# Patient Record
Sex: Male | Born: 1940 | Race: White | Hispanic: No | Marital: Married | Smoking: Current every day smoker
Health system: Southern US, Community
[De-identification: ages and names within clinical notes are randomized; demographics above are authoritative.]

## PROBLEM LIST (undated history)

## (undated) DIAGNOSIS — Z8719 Personal history of other diseases of the digestive system: Secondary | ICD-10-CM

## (undated) DIAGNOSIS — I714 Abdominal aortic aneurysm, without rupture, unspecified: Secondary | ICD-10-CM

## (undated) DIAGNOSIS — M1990 Unspecified osteoarthritis, unspecified site: Secondary | ICD-10-CM

## (undated) DIAGNOSIS — G4730 Sleep apnea, unspecified: Secondary | ICD-10-CM

## (undated) DIAGNOSIS — E785 Hyperlipidemia, unspecified: Secondary | ICD-10-CM

## (undated) DIAGNOSIS — K219 Gastro-esophageal reflux disease without esophagitis: Secondary | ICD-10-CM

## (undated) DIAGNOSIS — F419 Anxiety disorder, unspecified: Secondary | ICD-10-CM

## (undated) DIAGNOSIS — I10 Essential (primary) hypertension: Secondary | ICD-10-CM

## (undated) DIAGNOSIS — E119 Type 2 diabetes mellitus without complications: Secondary | ICD-10-CM

## (undated) DIAGNOSIS — N400 Enlarged prostate without lower urinary tract symptoms: Secondary | ICD-10-CM

## (undated) HISTORY — PX: CHOLECYSTECTOMY: SHX55

## (undated) HISTORY — DX: Abdominal aortic aneurysm, without rupture: I71.4

## (undated) HISTORY — DX: Abdominal aortic aneurysm, without rupture, unspecified: I71.40

## (undated) HISTORY — DX: Unspecified osteoarthritis, unspecified site: M19.90

## (undated) HISTORY — DX: Hyperlipidemia, unspecified: E78.5

## (undated) HISTORY — DX: Essential (primary) hypertension: I10

## (undated) HISTORY — DX: Benign prostatic hyperplasia without lower urinary tract symptoms: N40.0

## (undated) HISTORY — DX: Personal history of other diseases of the digestive system: Z87.19

## (undated) HISTORY — DX: Type 2 diabetes mellitus without complications: E11.9

## (undated) HISTORY — DX: Gastro-esophageal reflux disease without esophagitis: K21.9

---

## 1978-12-12 HISTORY — PX: VASECTOMY: SHX75

## 2007-05-04 ENCOUNTER — Ambulatory Visit: Payer: Self-pay | Admitting: *Deleted

## 2007-10-19 ENCOUNTER — Ambulatory Visit: Payer: Self-pay | Admitting: *Deleted

## 2008-09-05 ENCOUNTER — Ambulatory Visit: Payer: Self-pay | Admitting: *Deleted

## 2008-09-12 ENCOUNTER — Ambulatory Visit: Payer: Self-pay | Admitting: *Deleted

## 2008-09-12 ENCOUNTER — Encounter: Admission: RE | Admit: 2008-09-12 | Discharge: 2008-09-12 | Payer: Self-pay | Admitting: *Deleted

## 2009-04-01 ENCOUNTER — Ambulatory Visit: Payer: Self-pay | Admitting: Vascular Surgery

## 2009-10-03 ENCOUNTER — Ambulatory Visit: Payer: Self-pay | Admitting: Vascular Surgery

## 2010-04-21 ENCOUNTER — Ambulatory Visit
Admission: RE | Admit: 2010-04-21 | Discharge: 2010-04-21 | Payer: Self-pay | Source: Home / Self Care | Attending: Vascular Surgery | Admitting: Vascular Surgery

## 2010-04-21 ENCOUNTER — Ambulatory Visit: Admit: 2010-04-21 | Payer: Self-pay | Admitting: Vascular Surgery

## 2010-08-25 NOTE — Procedures (Signed)
DUPLEX ULTRASOUND OF ABDOMINAL AORTA   INDICATION:  Abdominal aortic aneurysm.   HISTORY:  Diabetes:  No.  Cardiac:  No.  Hypertension:  No.  Smoking:  Yes.  Connective Tissue Disorder:  Family History:  No.  Previous Surgery:  No.   DUPLEX EXAM:         AP (cm)                   TRANSVERSE (cm)  Proximal             2.6 cm                    2.5 cm  Mid                  2.7 cm                    2.7 cm  Distal               5.0 cm                    5.0 cm  Right Iliac          2.4 cm                    2.3 cm  Left Iliac           1.1 cm                    1.2 cm   PREVIOUS:  Date: 04/01/2009  AP:  4.75  TRANSVERSE:  4.94   IMPRESSION:  1. Stable aneurysmal dilatation of the distal abdominal aorta and      right proximal common iliac arteries when compared to the previous      examination.  2. 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   ___________________________________________  Larina Earthly, M.D.   CH/MEDQ  D:  10/03/2009  T:  10/03/2009  Job:  [id number]

## 2010-08-25 NOTE — Assessment & Plan Note (Signed)
OFFICE VISIT   CLAYBURN, WEEKLY  DOB:  25-Aug-1940                                       09/05/2008  ZOXWR#:[id number]   The patient returned to the office today for surveillance ultrasound of  a known AAA.  His aneurysm has enlarged significantly, now measuring 5.5  cm in maximal diameter.  This compares to a previous study of 4.5 cm  carried out 1 year ago.   He continues to smoke 1 pack of cigarettes daily.   There is no history of abdominal pain or back pain.   PAST MEDICAL HISTORY:  Significant for hypertension and hyperlipidemia.   He currently takes a multivitamin and aspirin daily.   No known allergies.  He is intolerant of codeine.   PHYSICAL EXAMINATION:  He is a 69 year old gentleman, appears  approximately stated age.  Alert and oriented.  BP 150/88, pulse 64 per  minute.  Neck:  Supple, without thyromegaly or adenopathy.  There are no  carotid bruits.  Heart Sounds:  Normal, without murmurs.  Chest:  Clear  with equal entry bilaterally.  Abdomen:  Soft, nontender.  AAA palpable.  No organomegaly or other masses felt.  Normal bowel sounds without  bruits.  Femoral pulses 2+ bilaterally.  No ankle edema.   The patient has a significantly enlarged abdominal aortic aneurysm.  I  have ordered a CT angiogram of the abdomen and pelvis and return visit  to see me for planned further management of his enlarging AAA.   Balinda Quails, M.D.  Electronically Signed   PGH/MEDQ  D:  09/05/2008  T:  09/06/2008  Job:  [id number]   cc:   Selinda Flavin A. Cleotis Nipper, M.D.

## 2010-08-25 NOTE — Consult Note (Signed)
VASCULAR SURGERY CONSULTATION   Jonathan Levine, Jonathan Levine  DOB:  1940/06/28                                       05/04/2007  ZOXWR#:[id number]   REFERRING PHYSICIAN:  Sherilyn Cooter A. Cleotis Nipper, M.D.   PRIMARY CARE PHYSICIAN:  Dr. Ceasar Mons.   REFERRAL DIAGNOSIS:  A 4.5 cm abdominal aortic aneurysm.   HISTORY:  The patient is a 70 year old male recently admitted through  the emergency department to [facility] in [place] with  abdominal pain.  A CT scan revealed cholelithiasis.  He was seen and  evaluated by Dr. Cleotis Nipper with evaluation revealing lower abdominal  discomfort, this resolved spontaneously.  His cholelithiasis was felt to  be asymptomatic.  He has had no recurrence of abdominal pain.  Denies  nausea or vomiting.  Regular bowel habits.  No flank pain or back pain.   CT scan revealed 4.5 cm infrarenal abdominal aortic aneurysm.  Right  common iliac artery measures 2 cm and prostatic enlargement is noted.   The patient denies a family history of abdominal aortic aneurysm.  Risk  factors for abdominal aortic aneurysm include hypertension, male sex and  tobacco use.   PAST MEDICAL HISTORY:  1. Hypertension.  2. Hyperlipidemia.   MEDICATIONS:  1. Sleeping pill.  2. The patient discontinued the use of antihypertensive and lipid      lowering agents on his own.   ALLERGIES:  CODEINE.   FAMILY HISTORY:  Mother died at age 67 with a history of end-stage renal  failure and had a pacemaker.  He was estranged from his father.  No  siblings.   SOCIAL HISTORY:  The patient is married with 2 children.  He is now  retired, had worked as a Nature conservation officer.  Smokes 1 to 1-1/2 packs of cigarettes  per day.  Does not consume alcohol.   REVIEW OF SYSTEMS:  Refer to patient encounter form.  This was reviewed  and discussed with the patient today.  Negative complaints for general,  cardiac, pulmonary, vascular, orthopedic, psychiatric, ent, hematologic  or skin.  He  does note symptoms of GI reflux.  Had some urinary  frequency with nocturia.  Occasional headaches.   PHYSICAL EXAMINATION:  General:  A 70 year old male appears his stated  age.  Alert and oriented.  No acute distress.  Vital signs:  BP 135/88,  pulse 74 per minute, regular.  Respirations are 18 per minute.  O2 sat  98%.  HEENT:  Mouth and throat are clear.  Normocephalic.  Extraocular  movements intact.  No scleral icterus.  Neck:  No lymphadenopathy.  Normal thyroid.  Chest:  Equal air entry bilaterally.  No rales or  rhonchi.  Normal percussion.  Cardiovascular:  No carotid bruits.  Normal heart sounds without murmurs.  Regular rate and rhythm.  No rubs  or gallops.  Abdomen:  Soft, nontender.  No masses or organomegaly.  Normal bowel sounds.  No bruits.  Extremities:  2+ femoral, popliteal,  posterior tibia and dorsalis pedis pulses.  No ankle edema.  Normal  range of motion upper and lower extremities.  No joint deformity.  Neurological:  Cranial nerves intact.  Strength equal bilaterally.  2+  reflexes.  Skin:  Intact.  Warm, dry.  No ulceration or rash.   IMPRESSION:  1. A 4.5 cm infrarenal abdominal aortic aneurysm.  2. Asymptomatic cholelithiasis.  3.  Hypertension.  4. Hyperlipidemia.  5. Poor medical compliance.   RECOMMENDATIONS:  Follow up in 6 months with ultrasound of the abdomen.  The patient needs to follow up with his primary care physician, Dr.  Jenean Lindau, regarding antihypertensive and antilipid agents.   Balinda Quails, M.D.  Electronically Signed  PGH/MEDQ  D:  05/04/2007  T:  05/05/2007  Job:  [id number]   cc:   Selinda Flavin A. Cleotis Nipper, M.D.

## 2010-08-25 NOTE — Assessment & Plan Note (Signed)
OFFICE VISIT   Jonathan Levine, Jonathan Levine  DOB:  1940/08/28                                       09/12/2008  EAVWU#:[id number]   The patient returned to the office today after undergoing a CT angiogram  of the abdomen and pelvis.  The indication for this was an enlarging  abdominal aortic aneurysm.  He had an ultrasound last week which reveals  aneurysm to measure 5.5 cm in maximal diameter.   By CT scan today his aneurysm measures 4.9 cm in maximal diameter.  This  compares to a study 1 year ago of 4.5 cm.  This is within the margin of  typical growth and requires no further treatment at this time.   Blood pressure is 140/88, pulse is 70 per minute.  His abdomen soft,  nontender.   Will plan follow-up in 6 months with repeat abdominal ultrasound.   Balinda Quails, M.D.  Electronically Signed   PGH/MEDQ  D:  09/12/2008  T:  09/13/2008  Job:  [id number]   cc:   Selinda Flavin A. Cleotis Nipper, M.D.

## 2010-08-25 NOTE — Procedures (Signed)
DUPLEX ULTRASOUND OF ABDOMINAL AORTA   INDICATION:  Followup evaluation of abdominal aortic aneurysm.   HISTORY:  Diabetes:  No.  Cardiac:  No.  Hypertension:  No.  Smoking:  Yes.  Connective Tissue Disorder:  Family History:  No.  Previous Surgery:  No.   DUPLEX EXAM:         AP (cm)                   TRANSVERSE (cm)  Proximal             2.57 cm                   2.88 cm  Mid                  4.78 cm                   5.52 cm  Distal               2.90 cm                   2.82 cm  Right Iliac          1.14 cm                   1.37 cm  Left Iliac           1.42 cm                   1.55 cm   PREVIOUS:  Date:  AP:  4.1  TRANSVERSE:  4.55   IMPRESSION:  There is a significant increase in abdominal aortic  aneurysm since last study done on 10/19/2007.   ___________________________________________  P. Liliane Bade, M.D.   AC/MEDQ  D:  09/05/2008  T:  09/05/2008  Job:  [id number]

## 2010-08-25 NOTE — Procedures (Signed)
DUPLEX ULTRASOUND OF ABDOMINAL AORTA   INDICATION:  Follow up abdominal aortic aneurysm.   HISTORY:  Diabetes:  No.  Cardiac:  No.  Hypertension:  No.  Smoking:  Yes.  Connective Tissue Disorder:  Family History:  No.  Previous Surgery:  No.   DUPLEX EXAM:         AP (cm)                   TRANSVERSE (cm)  Proximal             2.40 cm                   2.69 cm  Mid                  4.75 cm                   4.94 cm  Distal               2.68 cm                   2.74 cm  Right Iliac          2.07 cm                   2.10 cm  Left Iliac           1.37 cm                   1.18 cm   PREVIOUS:  Date: 09/12/08 (CT)  AP:  4.9  TRANSVERSE:   IMPRESSION:  1. Stable abdominal aortic aneurysm when compared to CT done 09/12/08.  2. Mural thrombus noted.  3. Stable right common iliac artery aneurysm, left common iliac artery      is within normal limits.   ___________________________________________  Larina Earthly, M.D.   AS/MEDQ  D:  04/01/2009  T:  04/02/2009  Job:  [id number]

## 2010-08-25 NOTE — Procedures (Signed)
DUPLEX ULTRASOUND OF ABDOMINAL AORTA   INDICATION:  Follow up AAA.   HISTORY:  Diabetes:  No.  Cardiac:  No.  Hypertension:  No.  Smoking:  No.  Connective Tissue Disorder:  Family History:  No.  Previous Surgery:  No.   DUPLEX EXAM:         AP (cm)                   TRANSVERSE (cm)  Proximal             2.52 cm                   2.4 cm  Mid                  4.98 cm                   4.93 cm  Distal               2.87 cm                   2.85 cm  Right Iliac          2.12 cm                   2.8 cm  Left Iliac           1.3 cm                    1.1 cm   PREVIOUS:  Date: 10/03/2009  AP:  5.0  TRANSVERSE:  5.0.  The right  iliac during that exam was 2.4 X 2.3 cm.   IMPRESSION:  1. Stable abdominal aortic aneurysm measuring approximately 4.98 cm X      4.93 cm.  2. 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   ___________________________________________  Larina Earthly, M.D.   LT/MEDQ  D:  04/21/2010  T:  04/21/2010  Job:  [id number]

## 2010-08-25 NOTE — Assessment & Plan Note (Signed)
OFFICE VISIT   Jonathan Levine, Jonathan Levine  DOB:  07/23/1940                                       10/19/2007  AOZHY#:[id number]   The patient returns to the office at this time for 6 month followup,  surveillance of a 4.5 cm abdominal aortic aneurysm.  He was last seen in  January of this year.  Ultrasound today reveals his aneurysm to be 4.5  cm, stable in size.   CURRENT MEDICATIONS:  1. Include a multivitamin.  2. Provigil.   ALLERGIES:  He is intolerant of codeine.   PHYSICAL EXAMINATION:  The patient appears generally well.  Alert and  oriented.  No acute distress.  BP 153/99, pulse 65 per minute,  respirations 16 per minute.  No carotid bruits.  Normal heart sounds  without murmurs.  Chest is clear.  Abdomen soft and nontender.  AAA  palpable without tenderness.  No organomegaly or other masses.  No  abdominal bruits.  Femoral pulses 2+ bilaterally.  No ankle edema.   The patient has a stable abdominal aortic aneurysm measuring 4.5 cm,  this remains asymptomatic.  Will plan followup with him again in 6  months with an abdominal ultrasound.   Balinda Quails, M.D.  Electronically Signed   PGH/MEDQ  D:  10/19/2007  T:  10/20/2007  Job:  [id number]

## 2010-08-25 NOTE — Assessment & Plan Note (Signed)
OFFICE VISIT   Jonathan, Levine  DOB:  22-Feb-1941                                       04/21/2010  ZOXWR#:[id number]   Patient presents today for continued serial follow-up of his infrarenal  abdominal aortic aneurysm.  He had been followed by Dr. Liliane Bade in  our office, but Dr. Madilyn Fireman has left our practice.  This initially was  discovered when he had developed a symptomatic cholelithiasis 3 years  ago.  He has had serial ultrasound follow-up showing no significant  increase in size.  He is here today for discussion and ultrasound follow-  up.   He is a retired Curator.  He denies any cardiac difficulties and is in  otherwise excellent health.  He is married with 2 children.  He is  retired.  He does smoke 2 packs of cigarettes per day.  He does not  drink alcohol on a regular basis.   PHYSICAL EXAMINATION:  A well-developed and well-nourished white male  appearing stated age in no acute distress.  Blood pressure is 127/84,  pulse 73, respirations 22.  HEENT is normal.  Chest:  Clear bilaterally  without rales, rhonchi, or wheezes.  Heart:  Regular rate and rhythm.  Abdomen is soft, nontender.  He does have a palpable aneurysm with no  tenderness over this.  Musculoskeletal shows no major deformity or  cyanosis.  Neurologic:  No focal weakness or paresthesias.  Skin without  ulcers or rashes.   He underwent repeat ultrasound of his aorta today, which I have ordered  and have independently reviewed.  This shows a maximal diameter of no  change since his last study in June 2011 with maximal size 5.0 cm.   I have recommended that we see him in 87-month intervals.  He understands  symptoms of leaking aneurysm and knows to report immediately to the  emergency department at [facility] should this occur.     Larina Earthly, M.D.  Electronically Signed   TFE/MEDQ  D:  04/21/2010  T:  04/21/2010  Job:  [id number]

## 2010-08-25 NOTE — Procedures (Signed)
DUPLEX ULTRASOUND OF ABDOMINAL AORTA   INDICATION:  Follow up abdominal aortic aneurysm, per CT.   HISTORY:  Diabetes:  No.  Cardiac:  No.  Hypertension:  No.  Smoking:  Yes.  Connective Tissue Disorder:  Family History:  Previous Surgery:   DUPLEX EXAM:         AP (cm)                   TRANSVERSE (cm)  Proximal             2.41 cm                   2.55 cm  Mid                  4.10 cm                   4.55 cm  Distal               2.65 cm                   2.27 cm  Right Iliac          1.31 cm                   1.06 cm  Left Iliac           1.27 cm                   1.19 cm   PREVIOUS:  Date:  AP:  4.5 per CT  TRANSVERSE:   IMPRESSION:  Abdominal aortic aneurysm noted with the largest  measurement of 4.10 cm X 4.55 cm.   ___________________________________________  P. Liliane Bade, M.D.   MG/MEDQ  D:  10/19/2007  T:  10/19/2007  Job:  [id number]

## 2010-10-27 ENCOUNTER — Ambulatory Visit (INDEPENDENT_AMBULATORY_CARE_PROVIDER_SITE_OTHER): Payer: BC Managed Care – PPO | Admitting: Vascular Surgery

## 2010-10-27 ENCOUNTER — Encounter (INDEPENDENT_AMBULATORY_CARE_PROVIDER_SITE_OTHER): Payer: BC Managed Care – PPO

## 2010-10-27 DIAGNOSIS — I714 Abdominal aortic aneurysm, without rupture, unspecified: Secondary | ICD-10-CM

## 2010-10-27 DIAGNOSIS — I739 Peripheral vascular disease, unspecified: Secondary | ICD-10-CM

## 2010-10-28 NOTE — Assessment & Plan Note (Signed)
OFFICE VISIT  Jonathan Levine, Jonathan Levine DOB:  03-28-1941                                       10/27/2010 ZOXWR#:[id number]  Patient presents today for continued follow-up of his known infrarenal abdominal aortic aneurysm.  This has been followed since initial discovery in 2009.  He continues to be quite active.  He has no cardiac or other dysfunction.  He is in very good health.  Does not have hypertension.  He remains quite active.  PHYSICAL EXAMINATION:  A well-developed and well-nourished white male in no acute distress.  Blood pressure is 127/73, pulse 60, respirations 16. HEENT is normal.  Abdomen:  Soft, nontender.  He does have palpable abdominal aortic aneurysm which is not tender.  He has 2+ femoral and 2+ dorsalis pedis pulses without any evidence of peripheral aneurysm.  He underwent a repeat duplex today, and this shows no significant change since his last study in January 2012.  Maximal diameter is 4.99 cm.  I discussed this at length with patient, explaining that his risk for rupture and risk for surgery are approximately equal. With a 5 cm aneurysm, I feel completely comfortable looking at this again in 6 months.  I again reviewed symptoms of leaking aneurysm with him, and he knows to notify us should this occur.  We will see him again in 6 months with continued ultrasound follow-up.    Larina Earthly, M.D. Electronically Signed  TFE/MEDQ  D:  10/27/2010  T:  10/28/2010  Job:  [id number]

## 2010-11-11 NOTE — Procedures (Unsigned)
DUPLEX ULTRASOUND OF ABDOMINAL AORTA  INDICATION:  Follow up AAA.  HISTORY: Diabetes:  No. Cardiac:  No. Hypertension:  No. Smoking:  No. Connective Tissue Disorder: Family History:  No. Previous Surgery:  No.  DUPLEX EXAM:         AP (cm)                   TRANSVERSE (cm) Proximal             4.09 cm                   4.02 cm Mid                  4.99 cm                   4.96 cm Distal               3.02 cm                   2.88 cm Right Iliac          1.15 cm                   1.34 cm Left Iliac           1.15 cm                   1.18 cm  PREVIOUS:  Date: 04/21/2010  AP:  4.98  TRANSVERSE:  4.93  IMPRESSION: 1. Abdominal aortic aneurysm noted with largest measurement of 4.99 X     4.96 cm. 2. Proximal abdominal aorta diameter appears increased in comparison     to the previous examination.  ___________________________________________ Larina Earthly, M.D.  EM/MEDQ  D:  10/27/2010  T:  10/27/2010  Job:  [id number]

## 2011-04-26 ENCOUNTER — Ambulatory Visit (INDEPENDENT_AMBULATORY_CARE_PROVIDER_SITE_OTHER): Payer: BC Managed Care – PPO | Admitting: *Deleted

## 2011-04-26 DIAGNOSIS — I714 Abdominal aortic aneurysm, without rupture: Secondary | ICD-10-CM

## 2011-05-10 ENCOUNTER — Other Ambulatory Visit: Payer: Self-pay | Admitting: *Deleted

## 2011-05-10 DIAGNOSIS — I714 Abdominal aortic aneurysm, without rupture: Secondary | ICD-10-CM

## 2011-05-12 ENCOUNTER — Encounter: Payer: Self-pay | Admitting: Vascular Surgery

## 2011-05-12 NOTE — Procedures (Unsigned)
DUPLEX ULTRASOUND OF ABDOMINAL AORTA  INDICATION:  Abdominal aortic aneurysm.  HISTORY: Diabetes:  No. Cardiac:  No. Hypertension:  No. Smoking:  No. Connective Tissue Disorder: Family History:  No. Previous Surgery:  No.  DUPLEX EXAM:         AP (cm)                   TRANSVERSE (cm) Proximal             2.5 cm                    2.7 cm Mid                  2.8 cm                    2.7 cm Distal               5.3 cm                    5.3 cm Right Iliac          Not visualized            Not visualized Left Iliac           Not visualized            Not visualized  PREVIOUS:  Date:  10/27/2010  AP:  4.99  TRANSVERSE:  4.96  IMPRESSION: 1. Aneurysmal dilatation of the mid to distal abdominal aorta with     mild increase in maximum diameter when compared to the previous     examination. 2. Unable to adequately visualize the bilateral common iliac arteries     due to overlying bowel gas patterns.  ___________________________________________ Larina Earthly, M.D.  CH/MEDQ  D:  04/28/2011  T:  04/28/2011  Job:  [id number]

## 2011-11-08 ENCOUNTER — Encounter: Payer: Self-pay | Admitting: Vascular Surgery

## 2011-11-08 ENCOUNTER — Encounter: Payer: Self-pay | Admitting: Neurosurgery

## 2011-11-09 ENCOUNTER — Encounter: Payer: Self-pay | Admitting: Neurosurgery

## 2011-11-09 ENCOUNTER — Encounter (INDEPENDENT_AMBULATORY_CARE_PROVIDER_SITE_OTHER): Payer: BC Managed Care – PPO | Admitting: *Deleted

## 2011-11-09 ENCOUNTER — Ambulatory Visit (INDEPENDENT_AMBULATORY_CARE_PROVIDER_SITE_OTHER): Payer: BC Managed Care – PPO | Admitting: Neurosurgery

## 2011-11-09 VITALS — BP 156/93 | HR 56 | Resp 16 | Ht 72.0 in | Wt 159.8 lb

## 2011-11-09 DIAGNOSIS — I714 Abdominal aortic aneurysm, without rupture, unspecified: Secondary | ICD-10-CM | POA: Insufficient documentation

## 2011-11-09 NOTE — Progress Notes (Signed)
[facility] Office Note  CC: Six-month AAA duplex surveillance Referring Physician: Early  History of Present Illness: 71 year old male patient of Dr. Arbie Cookey followed for known AAA. The patient denies any abdominal pain or back pain. The patient also denies any new medical diagnoses or recent surgery.  Past Medical History  Diagnosis Date  . GERD (gastroesophageal reflux disease)   . AAA (abdominal aortic aneurysm)   . BPH (benign prostatic hypertrophy)   . Arthritis   . Hypertension   . Hyperlipidemia   . H/O cholelithiasis     ROS: [x]  Positive   [ ]  Denies    General: [ ]  Weight loss, [ ]  Fever, [ ]  chills Neurologic: [ ]  Dizziness, [ ]  Blackouts, [ ]  Seizure [ ]  Stroke, [ ]  "Mini stroke", [ ]  Slurred speech, [ ]  Temporary blindness; [ ]  weakness in arms or legs, [ ]  Hoarseness Cardiac: [ x] Chest pain/pressure, [ ]  Shortness of breath at rest [ ]  Shortness of breath with exertion, [ ]  Atrial fibrillation or irregular heartbeat Vascular: [ ]  Pain in legs with walking, [ ]  Pain in legs at rest, [ ]  Pain in legs at night,  [ ]  Non-healing ulcer, [ ]  Blood clot in vein/DVT,   Pulmonary: [ ]  Home oxygen, [ ]  Productive cough, [ ]  Coughing up blood, [ ]  Asthma,  [ ]  Wheezing Musculoskeletal:  [ ]  Arthritis, [ ]  Low back pain, [ ]  Joint pain Hematologic: [ ]  Easy Bruising, [ ]  Anemia; [ ]  Hepatitis Gastrointestinal: [ ]  Blood in stool, [ ]  Gastroesophageal Reflux/heartburn, [ ]  Trouble swallowing Urinary: [ ]  chronic Kidney disease, [ ]  on HD - [ ]  MWF or [ ]  TTHS, [ ]  Burning with urination, [ ]  Difficulty urinating Skin: [ ]  Rashes, [ ]  Wounds Psychological: [ ]  Anxiety, [ ]  Depression   Social History History  Substance Use Topics  . Smoking status: Current Everyday Smoker -- 2.0 packs/day  . Smokeless tobacco: Not on file  . Alcohol Use: No    Family History Family History  Problem Relation Age of Onset  . Heart disease Mother    had pacemaker  . Kidney disease Mother     Allergies  Allergen Reactions  . Codeine     Halllucination    Current Outpatient Prescriptions  Medication Sig Dispense Refill  . aspirin 81 MG tablet Take 81 mg by mouth daily.      Marland Kitchen atorvastatin (LIPITOR) 40 MG tablet Take 40 mg by mouth daily.      Marland Kitchen doxazosin (CARDURA) 4 MG tablet Take 4 mg by mouth at bedtime.      . Dutasteride-Tamsulosin HCl (JALYN) 0.5-0.4 MG CAPS Take by mouth daily.      . Multiple Vitamin (MULTIVITAMIN) tablet Take 1 tablet by mouth daily.      . Omega-3 Fatty Acids (FISH OIL PO) Take by mouth.      . pantoprazole (PROTONIX) 40 MG tablet Take 40 mg by mouth daily.      . Tadalafil (CIALIS PO) Take by mouth.      . Tamsulosin HCl (FLOMAX) 0.4 MG CAPS Take by mouth.        Physical Examination  Filed Vitals:   11/09/11 0933  BP: 156/93  Pulse: 56  Resp: 16    Body mass index is 21.67 kg/(m^2).  General:  WDWN in NAD Gait: Normal HEENT: WNL Eyes: Pupils equal Pulmonary: normal non-labored breathing , without Rales, rhonchi,  wheezing Cardiac: RRR, without  Murmurs, rubs or gallops; No carotid bruits Abdomen: soft, NT, no masses Skin: no rashes, ulcers noted Vascular Exam/Pulses: 3+ radial pulses bilaterally, no carotid bruits are heard, abdominal aortic aneurysm is pulsatile and palpable  Extremities without ischemic changes, no Gangrene , no cellulitis; no open wounds;  Musculoskeletal: no muscle wasting or atrophy  Neurologic: A&O X 3; Appropriate Affect ; SENSATION: normal; MOTOR FUNCTION:  moving all extremities equally. Speech is fluent/normal  Non-Invasive Vascular Imaging: Duplex today shows a maximum diameter of 5.29 which is unchanged from January 2013. I discussed the findings with Dr. Arbie Cookey who was to see the patient back in 6 months with a CT of the abdomen pelvis.  ASSESSMENT/PLAN: Asymptomatic patient with known AAA, plan as above. The patient's in agreement with this, his questions  were encouraged and answered.  Lauree Chandler ANP  Clinic M.D.: Early

## 2011-11-09 NOTE — Addendum Note (Signed)
Addended by: Sharee Pimple on: 11/09/2011 10:51 AM   Modules accepted: Orders

## 2011-11-16 NOTE — Procedures (Unsigned)
DUPLEX ULTRASOUND OF ABDOMINAL AORTA  INDICATION:  Followup AAA  HISTORY: Diabetes:  No Cardiac:  No Hypertension:  No Smoking:  No Connective Tissue Disorder: Family History:  No Previous Surgery:  No  DUPLEX EXAM:         AP (cm)                   TRANSVERSE (cm) Proximal             2.24 cm                   cm Mid                  5.20 cm                   5.20 cm Distal               5.29 cm                   cm Right Iliac          1.13 cm                   cm Left Iliac           Not visualized            cm  PREVIOUS:  Date:  04/26/2011  AP:  5.3  TRANSVERSE:  5.3  IMPRESSION: 1. Aneurysmal mid to distal abdominal aorta with a maximum diameter     measured today of 5.29 cm AP. 2. Somewhat limited visualization due to overlying bowel gas.  ___________________________________________ Larina Earthly, M.D.  EM/MEDQ  D:  11/09/2011  T:  11/09/2011  Job:  [id number]

## 2012-05-15 ENCOUNTER — Other Ambulatory Visit: Payer: Self-pay | Admitting: Vascular Surgery

## 2012-05-15 ENCOUNTER — Encounter: Payer: Self-pay | Admitting: Vascular Surgery

## 2012-05-15 LAB — BUN: BUN: 10 mg/dL (ref 6–23)

## 2012-05-15 LAB — CREATININE, SERUM: Creat: 1.01 mg/dL (ref 0.50–1.35)

## 2012-05-16 ENCOUNTER — Ambulatory Visit
Admission: RE | Admit: 2012-05-16 | Discharge: 2012-05-16 | Disposition: A | Discharge status: Home / Self Care | Payer: Medicare Other | Source: Ambulatory Visit | Attending: Neurosurgery | Admitting: Neurosurgery

## 2012-05-16 ENCOUNTER — Encounter: Payer: Self-pay | Admitting: Vascular Surgery

## 2012-05-16 ENCOUNTER — Ambulatory Visit (INDEPENDENT_AMBULATORY_CARE_PROVIDER_SITE_OTHER): Payer: BC Managed Care – PPO | Admitting: Vascular Surgery

## 2012-05-16 VITALS — BP 156/95 | HR 66 | Resp 18 | Ht 72.0 in | Wt 161.0 lb

## 2012-05-16 DIAGNOSIS — Z01818 Encounter for other preprocedural examination: Secondary | ICD-10-CM

## 2012-05-16 DIAGNOSIS — I714 Abdominal aortic aneurysm, without rupture: Secondary | ICD-10-CM

## 2012-05-16 MED ORDER — IOHEXOL 350 MG/ML SOLN
80.0000 mL | Freq: Once | INTRAVENOUS | Status: AC | PRN
  Administered 2012-05-16: 80 mL via INTRAVENOUS

## 2012-05-16 NOTE — Progress Notes (Signed)
[facility] Specialist of [facility]   Patient name: Jonathan Levine MRN: [id number] DOB: 1940/07/07 Sex: male   Referred by: Jenean Lindau  Reason for referral:  Chief Complaint  Patient presents with  . Follow-up    6 month FU  CT abdomen/pelvis 05-16-2012     . AAA    HISTORY OF PRESENT ILLNESS: Patient is a for a [place] followup of his abdominal aortic aneurysm. He has been followed in our office for 5 years. He initially had discovery of this with a D. scan for evaluation of abdominal pain in 2009. 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 He has been followed in our office with serial ultrasounds showing continued slow increase in size. At the last visit we have recommended that he have a CT scan on this followup for better definition. Has no symptoms referable to his aneurysm. He is quite active with no major medical difficulties. He specifically denies any cardiac difficulties.  Past Medical History  Diagnosis Date  . GERD (gastroesophageal reflux disease)   . AAA (abdominal aortic aneurysm)   . BPH (benign prostatic hypertrophy)   . Arthritis   . Hypertension   . Hyperlipidemia   . H/O cholelithiasis     Past Surgical History  Procedure Date  . Vasectomy 1980's    History   Social History  . Marital Status: Married    Spouse Name: N/A    Number of Children: N/A  . Years of Education: N/A   Occupational History  . Not on file.   Social History Main Topics  . Smoking status: Current Every Day Smoker -- 1.0 packs/day  . Smokeless tobacco: Not on file  . Alcohol Use: No  . Drug Use: No  . Sexually Active:    Other Topics Concern  . Not on file   Social History Narrative  . No narrative on file    Family History  Problem Relation Age of Onset  . Heart disease Mother     had pacemaker  . Kidney disease Mother     Allergies as of 05/16/2012 - Review  Complete 05/16/2012  Allergen Reaction Noted  . Codeine  11/08/2011    Current Outpatient Prescriptions on File Prior to Visit  Medication Sig Dispense Refill  . aspirin 81 MG tablet Take 81 mg by mouth daily.      Marland Kitchen atorvastatin (LIPITOR) 40 MG tablet Take 40 mg by mouth daily.      Marland Kitchen doxazosin (CARDURA) 4 MG tablet Take 4 mg by mouth at bedtime.      . Dutasteride-Tamsulosin HCl (JALYN) 0.5-0.4 MG CAPS Take by mouth daily.      . Multiple Vitamin (MULTIVITAMIN) tablet Take 1 tablet by mouth daily.      . Omega-3 Fatty Acids (FISH OIL PO) Take by mouth.      . pantoprazole (PROTONIX) 40 MG tablet Take 40 mg by mouth daily.      . Tamsulosin HCl (FLOMAX) 0.4 MG CAPS Take by mouth.      . Tadalafil (CIALIS PO) Take by mouth.         REVIEW OF SYSTEMS:  Positives indicated with an "X"  CARDIOVASCULAR:  [ ]  chest pain   [ ]  chest pressure   [ ]  palpitations   [ ]  orthopnea   [ ]  dyspnea on exertion   [ ]  claudication   [ ]   rest pain   [ ]  DVT   [ ]  phlebitis PULMONARY:   [ ]  productive cough   [ ]  asthma   [ ]  wheezing NEUROLOGIC:   [ ]  weakness  [ ]  paresthesias  [ ]  aphasia  [ ]  amaurosis  [ ]  dizziness HEMATOLOGIC:   [ ]  bleeding problems   [ ]  clotting disorders MUSCULOSKELETAL:  [ ]  joint pain   [ ]  joint swelling GASTROINTESTINAL: [ ]   blood in stool  [ ]   hematemesis GENITOURINARY:  [ ]   dysuria  [ ]   hematuria PSYCHIATRIC:  [ ]  history of major depression INTEGUMENTARY:  [ ]  rashes  [ ]  ulcers CONSTITUTIONAL:  [ ]  fever   [ ]  chills  PHYSICAL EXAMINATION:  General: The patient is a well-nourished male, in no acute distress. Vital signs are BP 156/95  Pulse 66  Resp 18  Ht 6' (1.829 m)  Wt 161 lb (73.029 kg)  BMI 21.84 kg/m2 Pulmonary: There is a good air exchange bilaterally without wheezing or rales. Abdomen: Soft and non-tender with normal pitch bowel sounds. Easily palpable nontender abdominal aortic aneurysm Musculoskeletal: There are no major deformities.   There is no significant extremity pain. Neurologic: No focal weakness or paresthesias are detected, Skin: There are no ulcer or rashes noted. Psychiatric: The patient has normal affect. Cardiovascular: There is a regular rate and rhythm without significant murmur appreciated. Pulse status: 2+ radial 2+ femoral 2+ popliteal 2+ dorsalis pedis pulses bilaterally with no evidence of peripheral aneurysm  CT scan shows marked increase in size with a 6.5 cm aneurysm. This does extend into the right iliac artery down to the hypogastric artery takeoff.  Impression and Plan:  Enlarging asymptomatic abdominal aortic aneurysm and right common iliac artery aneurysm. I discussed the significance of this at length with the patient. I have recommended elective repair. I explained the options of open standard aneurysm resection and also the option of the stent graft repair. I explained the magnitude of the open repair and that it would be less long-term followup band stent graft repair which would require lifelong CT followup. The patient understands and wished to discuss this with his wife and see me back for a further discussion with her present to determine the best treatment option. I did explain that he would require coiling of his hypogastric artery on the right if we just terminated that stent grafting is the best option for him. I explained this would be as an outpatient pre-stent grafting. We'll discuss this further at his next office visit. We will arrange a cardiac clearance prior to surgery.    EARLY, TODD [facility] [place] Office: [phone]

## 2012-05-17 DIAGNOSIS — K219 Gastro-esophageal reflux disease without esophagitis: Secondary | ICD-10-CM | POA: Insufficient documentation

## 2012-05-17 DIAGNOSIS — Z8719 Personal history of other diseases of the digestive system: Secondary | ICD-10-CM | POA: Insufficient documentation

## 2012-05-17 DIAGNOSIS — N400 Enlarged prostate without lower urinary tract symptoms: Secondary | ICD-10-CM | POA: Insufficient documentation

## 2012-05-17 DIAGNOSIS — I10 Essential (primary) hypertension: Secondary | ICD-10-CM | POA: Insufficient documentation

## 2012-05-17 DIAGNOSIS — M1990 Unspecified osteoarthritis, unspecified site: Secondary | ICD-10-CM | POA: Insufficient documentation

## 2012-05-17 DIAGNOSIS — E785 Hyperlipidemia, unspecified: Secondary | ICD-10-CM | POA: Insufficient documentation

## 2012-05-18 ENCOUNTER — Ambulatory Visit (INDEPENDENT_AMBULATORY_CARE_PROVIDER_SITE_OTHER): Payer: BC Managed Care – PPO | Admitting: Cardiovascular Disease

## 2012-05-18 ENCOUNTER — Encounter: Payer: Self-pay | Admitting: Cardiovascular Disease

## 2012-05-18 VITALS — BP 164/82 | HR 69 | Ht 72.0 in | Wt 162.0 lb

## 2012-05-18 DIAGNOSIS — I714 Abdominal aortic aneurysm, without rupture, unspecified: Secondary | ICD-10-CM

## 2012-05-18 DIAGNOSIS — I10 Essential (primary) hypertension: Secondary | ICD-10-CM

## 2012-05-18 DIAGNOSIS — Z01810 Encounter for preprocedural cardiovascular examination: Secondary | ICD-10-CM

## 2012-05-18 DIAGNOSIS — E785 Hyperlipidemia, unspecified: Secondary | ICD-10-CM

## 2012-05-18 DIAGNOSIS — Z01818 Encounter for other preprocedural examination: Secondary | ICD-10-CM

## 2012-05-18 NOTE — Assessment & Plan Note (Signed)
Smoker with known vascular disease with potiential for large open operation and AAA repair.  F/U lexiscan myovue to assess risk  Will not walk on treadmill due to size of AAA.  Discussed benefits of smoking cessation prior to operation but he has little motivation to quit.  May be at risk for PAF given ambient PAC;s but dont think beta blockers needed at this time

## 2012-05-18 NOTE — Assessment & Plan Note (Signed)
Well controlled.  Continue current medications and low sodium Dash type diet.    

## 2012-05-18 NOTE — Assessment & Plan Note (Signed)
Cholesterol is at goal.  Continue current dose of statin and diet Rx.  No myalgias or side effects.  F/U  LFT's in 6 months. No results found for this basename: LDLCALC             

## 2012-05-18 NOTE — Patient Instructions (Addendum)
Your physician has requested that you have a lexiscan myoview. Please follow instruction sheet, as given.  Your physician wants you to follow-up in: AS NEEDED You will receive a reminder letter in the mail two months in advance. If you don't receive a letter, please call our office to schedule the follow-up appointment.

## 2012-05-18 NOTE — Assessment & Plan Note (Addendum)
Will see Dr Arbie Cookey Tuesday I suspect he will opt for attempt at stent grafting.  Will need hypogastric coiled as well

## 2012-05-18 NOTE — Progress Notes (Signed)
Patient ID: Jonathan Levine, male   DOB: 12/05/1940, 72 y.o.   MRN: [id number] 72 yo referred by Dr Early for preop clearance Has an expanding AAA involving the right iliac that is now 6.6 cm.  Has not decided if he will have an open procedure or attempted stent grafting with coiling of the hypogastric.  No chest pain or history of carotid or CAD.  ECG with PAC;s  Still smoking.  Activity level is ok and still works part time.  Mild exertional dyspnea. CRF;s smoking and elevated lipids on statin    ROS: Denies fever, malais, weight loss, blurry vision, decreased visual acuity, cough, sputum, SOB, hemoptysis, pleuritic pain, palpitaitons, heartburn, abdominal pain, melena, lower extremity edema, claudication, or rash.  All other systems reviewed and negative   General: Affect appropriate Healthy:  appears stated age HEENT: normal Neck supple with no adenopathy JVP normal no bruits no thyromegaly Lungs clear with no wheezing and good diaphragmatic motion Heart:  S1/S2 no murmur,rub, gallop or click PMI normal Abdomen: benighn, BS positve, large palpable AAA no bruit.  No HSM or HJR Distal pulses intact with no bruits No edema Neuro non-focal Skin warm and dry No muscular weakness  Medications Current Outpatient Prescriptions  Medication Sig Dispense Refill  . aspirin 81 MG tablet Take 81 mg by mouth daily.      Marland Kitchen atorvastatin (LIPITOR) 40 MG tablet Take 40 mg by mouth daily.      . Dutasteride-Tamsulosin HCl (JALYN) 0.5-0.4 MG CAPS Take by mouth daily.      . Multiple Vitamin (MULTIVITAMIN) tablet Take 1 tablet by mouth daily.      . Omega-3 Fatty Acids (FISH OIL PO) Take by mouth.      . pantoprazole (PROTONIX) 40 MG tablet Take 40 mg by mouth daily.      . Tadalafil (CIALIS PO) Take by mouth.      . doxazosin (CARDURA) 4 MG tablet Take 4 mg by mouth at bedtime.        Allergies Codeine  Family History: Family History  Problem Relation Age of Onset  . Heart disease Mother    had pacemaker  . Kidney disease Mother     Social History: History   Social History  . Marital Status: Married    Spouse Name: N/A    Number of Children: N/A  . Years of Education: N/A   Occupational History  . Not on file.   Social History Main Topics  . Smoking status: Current Every Day Smoker -- 1.0 packs/day  . Smokeless tobacco: Not on file  . Alcohol Use: No  . Drug Use: No  . Sexually Active:    Other Topics Concern  . Not on file   Social History Narrative  . No narrative on file    Electrocardiogram:  NSR PAC rate 69  Assessment and Plan

## 2012-05-22 ENCOUNTER — Ambulatory Visit (HOSPITAL_COMMUNITY): Payer: BC Managed Care – PPO | Attending: Cardiology | Admitting: Radiology

## 2012-05-22 ENCOUNTER — Encounter: Payer: Self-pay | Admitting: Vascular Surgery

## 2012-05-22 VITALS — BP 119/88 | Ht 72.0 in | Wt 159.0 lb

## 2012-05-22 DIAGNOSIS — R0609 Other forms of dyspnea: Secondary | ICD-10-CM | POA: Insufficient documentation

## 2012-05-22 DIAGNOSIS — E785 Hyperlipidemia, unspecified: Secondary | ICD-10-CM | POA: Insufficient documentation

## 2012-05-22 DIAGNOSIS — I491 Atrial premature depolarization: Secondary | ICD-10-CM

## 2012-05-22 DIAGNOSIS — R0989 Other specified symptoms and signs involving the circulatory and respiratory systems: Secondary | ICD-10-CM

## 2012-05-22 DIAGNOSIS — I10 Essential (primary) hypertension: Secondary | ICD-10-CM | POA: Insufficient documentation

## 2012-05-22 DIAGNOSIS — Z8249 Family history of ischemic heart disease and other diseases of the circulatory system: Secondary | ICD-10-CM | POA: Insufficient documentation

## 2012-05-22 DIAGNOSIS — Z01818 Encounter for other preprocedural examination: Secondary | ICD-10-CM

## 2012-05-22 DIAGNOSIS — R002 Palpitations: Secondary | ICD-10-CM | POA: Insufficient documentation

## 2012-05-22 DIAGNOSIS — F17200 Nicotine dependence, unspecified, uncomplicated: Secondary | ICD-10-CM | POA: Insufficient documentation

## 2012-05-22 DIAGNOSIS — R0789 Other chest pain: Secondary | ICD-10-CM | POA: Insufficient documentation

## 2012-05-22 MED ORDER — TECHNETIUM TC 99M SESTAMIBI GENERIC - CARDIOLITE
10.0000 | Freq: Once | INTRAVENOUS | Status: AC | PRN
  Administered 2012-05-22: 10 via INTRAVENOUS

## 2012-05-22 MED ORDER — TECHNETIUM TC 99M SESTAMIBI GENERIC - CARDIOLITE
30.0000 | Freq: Once | INTRAVENOUS | Status: AC | PRN
  Administered 2012-05-22: 30 via INTRAVENOUS

## 2012-05-22 MED ORDER — REGADENOSON 0.4 MG/5ML IV SOLN
0.4000 mg | Freq: Once | INTRAVENOUS | Status: AC
  Administered 2012-05-22: 0.4 mg via INTRAVENOUS

## 2012-05-22 NOTE — Progress Notes (Signed)
  [facility] SITE 3 NUCLEAR MED [address] [place], [place] [address] [phone]    Cardiology Nuclear Med Study  Stuart Mirabile is a 72 y.o. male     MRN : [id number]     DOB: 01-24-1941  Procedure Date: 05/22/2012  Nuclear Med Background Indication for Stress Test:  Evaluation for Ischemia and Surgical Clearance: Pending AAA Repair with Dr. Arbie Cookey History:  AAA Cardiac Risk Factors: Family History - CAD, Hypertension, Lipids and Smoker  Symptoms:  DOE and Palpitations   Nuclear Pre-Procedure Caffeine/Decaff Intake:  10:00pm NPO After: 8:00am   Lungs:  clear O2 Sat: 96% on room air. IV 0.9% NS with Angio Cath:  22g  IV Site: R Wrist  IV Started by:  Cathlyn Parsons, RN  Chest Size (in):  38 Cup Size: n/a  Height: 6' (1.829 m)  Weight:  159 lb (72.122 kg)  BMI:  Body mass index is 21.56 kg/(m^2). Tech Comments:  n/a    Nuclear Med Study 1 or 2 day study: 1 day  Stress Test Type:  Lexiscan  Reading MD: Olga Millers, MD  Order Authorizing Provider:  Burna Cash  Resting Radionuclide: Technetium 74m Sestamibi  Resting Radionuclide Dose: 11.0 mCi   Stress Radionuclide:  Technetium 27m Sestamibi  Stress Radionuclide Dose: 33.0 mCi           Stress Protocol Rest HR: 60 Stress HR: 94  Rest BP: 119/88 Stress BP: 156/98  Exercise Time (min): n/a METS: n/a   Predicted Max HR: 151 bpm % Max HR: 62.25 bpm Rate Pressure Product: 41324   Dose of Adenosine (mg):  n/a Dose of Lexiscan: 0.4 mg  Dose of Atropine (mg): n/a Dose of Dobutamine: n/a mcg/kg/min (at max HR)  Stress Test Technologist: Milana Na, EMT-P  Nuclear Technologist:  Domenic Polite, CNMT     Rest Procedure:  Myocardial perfusion imaging was performed at rest 45 minutes following the intravenous administration of Technetium 22m Sestamibi. Rest ECG: NSR, Pacs.  Stress Procedure:  The patient received IV Lexiscan 0.4 mg over 15-seconds.  Technetium 65m Sestamibi injected at  30-seconds.  Quantitative spect images were obtained after a 45 minute delay. Stress ECG: No significant ST segment change suggestive of ischemia.  QPS Raw Data Images:  Acquisition technically good; normal left ventricular size. Stress Images:  There is decreased uptake in the apex. Rest Images:  Normal homogeneous uptake in all areas of the myocardium. Subtraction (SDS):  These findings are consistent with ischemia. Transient Ischemic Dilatation (Normal <1.22):  0.98 Lung/Heart Ratio (Normal <0.45):  0.35  Quantitative Gated Spect Images QGS EDV:  91 ml QGS ESV:  37 ml  Impression Exercise Capacity:  Lexiscan with no exercise. BP Response:  Normal blood pressure response. Clinical Symptoms:  There is chest pain and dyspnea ECG Impression:  No significant ST segment change suggestive of ischemia. Comparison with Prior Nuclear Study: No images to compare  Overall Impression:  Low risk stress nuclear study with a small, severe, reversible apical defect consistent with mild apical ischemia.  LV Ejection Fraction: 59%.  LV Wall Motion:  NL LV Function; NL Wall Motion  Olga Millers

## 2012-05-23 ENCOUNTER — Encounter: Payer: Self-pay | Admitting: Vascular Surgery

## 2012-05-23 ENCOUNTER — Ambulatory Visit (INDEPENDENT_AMBULATORY_CARE_PROVIDER_SITE_OTHER): Payer: BC Managed Care – PPO | Admitting: Vascular Surgery

## 2012-05-23 VITALS — BP 124/70 | HR 68 | Ht 72.0 in | Wt 159.0 lb

## 2012-05-23 DIAGNOSIS — I714 Abdominal aortic aneurysm, without rupture: Secondary | ICD-10-CM

## 2012-05-23 NOTE — Progress Notes (Signed)
The patient presents today for followup in continued discussion of his 6.6 cm infrarenal abdominal aortic aneurysm. We have discussed this last week and he is here today with his wife for continued discussion. He has seen Dr.Nishan for preop cardiac evaluation and I have seen the results of his Cardiolite. He has no change in his physical exam.  Past Medical History  Diagnosis Date  . GERD (gastroesophageal reflux disease)   . AAA (abdominal aortic aneurysm)   . BPH (benign prostatic hypertrophy)   . Arthritis   . Hypertension   . Hyperlipidemia   . H/O cholelithiasis     History  Substance Use Topics  . Smoking status: Current Every Day Smoker -- 1.00 packs/day  . Smokeless tobacco: Not on file  . Alcohol Use: No    Family History  Problem Relation Age of Onset  . Heart disease Mother     had pacemaker  . Kidney disease Mother     Allergies  Allergen Reactions  . Codeine     Halllucination    Current outpatient prescriptions:aspirin 81 MG tablet, Take 81 mg by mouth daily., Disp: , Rfl: ;  atorvastatin (LIPITOR) 40 MG tablet, Take 40 mg by mouth daily., Disp: , Rfl: ;  doxazosin (CARDURA) 4 MG tablet, Take 4 mg by mouth at bedtime., Disp: , Rfl: ;  Dutasteride-Tamsulosin HCl (JALYN) 0.5-0.4 MG CAPS, Take by mouth daily., Disp: , Rfl: ;  Multiple Vitamin (MULTIVITAMIN) tablet, Take 1 tablet by mouth daily., Disp: , Rfl:  Omega-3 Fatty Acids (FISH OIL PO), Take by mouth., Disp: , Rfl: ;  pantoprazole (PROTONIX) 40 MG tablet, Take 40 mg by mouth daily., Disp: , Rfl: ;  Tadalafil (CIALIS PO), Take by mouth., Disp: , Rfl:   BP 124/70  Pulse 68  Ht 6' (1.829 m)  Wt 159 lb (72.122 kg)  BMI 21.56 kg/m2  SpO2 98%  Body mass index is 21.56 kg/(m^2).       I again reviewed his CT scan with the patient and his wife. I discussed the option of open aneurysm repair and stent graft repair. I explained that I reviewed his films with the device manufacturer's last visit with him 1  week ago. I explained that this could have a flared right limb of his stent graft that would that not require coiling of his hypogastric aneurysm. I explained the benefits and disadvantage of this option and would recommend this if we proceed with stent graft repair. He and his wife both want to proceed with stent grafting and we will do this at his earliest convenience. We will confirm that his cardiac status is stable and we'll plan for stent graft repair next week. 

## 2012-05-24 ENCOUNTER — Encounter (HOSPITAL_COMMUNITY): Payer: Self-pay

## 2012-05-26 ENCOUNTER — Other Ambulatory Visit: Payer: Self-pay | Admitting: *Deleted

## 2012-05-29 ENCOUNTER — Ambulatory Visit (HOSPITAL_COMMUNITY)
Admission: RE | Admit: 2012-05-29 | Discharge: 2012-05-29 | Disposition: A | Discharge status: Home / Self Care | Payer: BC Managed Care – PPO | Source: Ambulatory Visit | Attending: Anesthesiology | Admitting: Anesthesiology

## 2012-05-29 ENCOUNTER — Encounter (HOSPITAL_COMMUNITY): Payer: Self-pay

## 2012-05-29 ENCOUNTER — Encounter (HOSPITAL_COMMUNITY)
Admission: RE | Admit: 2012-05-29 | Discharge: 2012-05-29 | Disposition: A | Discharge status: Home / Self Care | Payer: BC Managed Care – PPO | Source: Ambulatory Visit | Attending: Vascular Surgery | Admitting: Vascular Surgery

## 2012-05-29 DIAGNOSIS — I714 Abdominal aortic aneurysm, without rupture, unspecified: Secondary | ICD-10-CM | POA: Insufficient documentation

## 2012-05-29 DIAGNOSIS — Z01812 Encounter for preprocedural laboratory examination: Secondary | ICD-10-CM | POA: Insufficient documentation

## 2012-05-29 DIAGNOSIS — Z01818 Encounter for other preprocedural examination: Secondary | ICD-10-CM | POA: Insufficient documentation

## 2012-05-29 HISTORY — DX: Sleep apnea, unspecified: G47.30

## 2012-05-29 HISTORY — DX: Anxiety disorder, unspecified: F41.9

## 2012-05-29 LAB — URINALYSIS, ROUTINE W REFLEX MICROSCOPIC
Bilirubin Urine: NEGATIVE
Nitrite: NEGATIVE
Specific Gravity, Urine: 1.007 (ref 1.005–1.030)
Urobilinogen, UA: 0.2 mg/dL (ref 0.0–1.0)
pH: 6.5 (ref 5.0–8.0)

## 2012-05-29 LAB — COMPREHENSIVE METABOLIC PANEL
ALT: 12 U/L (ref 0–53)
Albumin: 3.9 g/dL (ref 3.5–5.2)
Alkaline Phosphatase: 53 U/L (ref 39–117)
BUN: 10 mg/dL (ref 6–23)
Chloride: 103 mEq/L (ref 96–112)
Glucose, Bld: 120 mg/dL — ABNORMAL HIGH (ref 70–99)
Potassium: 4.1 mEq/L (ref 3.5–5.1)
Sodium: 139 mEq/L (ref 135–145)
Total Bilirubin: 0.5 mg/dL (ref 0.3–1.2)

## 2012-05-29 LAB — CBC
HCT: 39.3 % (ref 39.0–52.0)
Hemoglobin: 13.2 g/dL (ref 13.0–17.0)
RDW: 13.7 % (ref 11.5–15.5)
WBC: 7.9 10*3/uL (ref 4.0–10.5)

## 2012-05-29 LAB — TYPE AND SCREEN: Antibody Screen: NEGATIVE

## 2012-05-29 LAB — URINE MICROSCOPIC-ADD ON

## 2012-05-29 LAB — BLOOD GAS, ARTERIAL
Bicarbonate: 26.4 mEq/L — ABNORMAL HIGH (ref 20.0–24.0)
TCO2: 27.7 mmol/L (ref 0–100)
pCO2 arterial: 41.8 mmHg (ref 35.0–45.0)
pH, Arterial: 7.417 (ref 7.350–7.450)

## 2012-05-29 LAB — ABO/RH: ABO/RH(D): A POS

## 2012-05-29 LAB — SURGICAL PCR SCREEN
MRSA, PCR: NEGATIVE
Staphylococcus aureus: POSITIVE — AB

## 2012-05-29 LAB — PROTIME-INR: Prothrombin Time: 12.5 seconds (ref 11.6–15.2)

## 2012-05-29 NOTE — Pre-Procedure Instructions (Signed)
JOAKIM HUESMAN  05/29/2012   Your procedure is scheduled on:  Wednesday, Febrauary 19th.  Report to [facility] at 6:45 AM.   Call this number if you have problems the morning of surgery: [phone]   Remember:   Do not eat food or drink liquids after midnight.    Take these medicines the morning of surgery with A SIP OF WATER: Doxazosin (Cardura), Pantprazole (Protonix).   Stop taking Aspirin, Coumadin, Plavix, Effient and Herbal medications. (Fish Oil,  M.ultiple Vitamin)  Do not take any NSAIDs ie: Ibuprofen,  Advil,Naproxen or any medication containing Aspirin.    Do not wear jewelry, make-up or nail polish.  Do not wear lotions, powders, or perfumes. You may wear deodorant.              Men may shave face and neck.  Do not bring valuables to the hospital.  Contacts, dentures or bridgework may not be worn into surgery.  Leave suitcase in the car. After surgery it may be brought to your room.  For patients admitted to the hospital, checkout time is 11:00 AM the day of discharge.    Special Instructions: Shower using CHG 2 nights before surgery and the night before surgery.  If you shower the day of surgery use CHG.  Use special wash - you have one bottle of CHG for all showers.  You should use approximately 1/3 of the bottle for each shower.   Please read over the following fact sheets that you were given: Pain Booklet, Coughing and Deep Breathing and Anesthesia Post-op Instructions

## 2012-05-30 ENCOUNTER — Ambulatory Visit: Payer: Medicare Other | Admitting: Vascular Surgery

## 2012-05-30 MED ORDER — SODIUM CHLORIDE 0.9 % IV SOLN: INTRAVENOUS | Status: DC

## 2012-05-30 MED ORDER — CEFUROXIME SODIUM 1.5 G IJ SOLR
1.5000 g | INTRAMUSCULAR | Status: DC
  Filled 2012-05-30: qty 1.5

## 2012-05-30 MED ORDER — DEXTROSE 5 % IV SOLN
1.5000 g | INTRAVENOUS | Status: AC
  Administered 2012-05-31: 1.5 g via INTRAVENOUS
  Filled 2012-05-30: qty 1.5

## 2012-05-31 ENCOUNTER — Inpatient Hospital Stay (HOSPITAL_COMMUNITY)
Admission: RE | Admit: 2012-05-31 | Discharge: 2012-06-01 | DRG: 111 | Disposition: A | Discharge status: Home / Self Care | Payer: BC Managed Care – PPO | Source: Ambulatory Visit | Attending: Vascular Surgery | Admitting: Vascular Surgery

## 2012-05-31 ENCOUNTER — Encounter (HOSPITAL_COMMUNITY)
Admission: RE | Disposition: A | Discharge status: Home / Self Care | Payer: Self-pay | Source: Ambulatory Visit | Attending: Vascular Surgery

## 2012-05-31 ENCOUNTER — Encounter (HOSPITAL_COMMUNITY): Payer: Self-pay | Admitting: Anesthesiology

## 2012-05-31 ENCOUNTER — Inpatient Hospital Stay (HOSPITAL_COMMUNITY): Payer: BC Managed Care – PPO

## 2012-05-31 ENCOUNTER — Inpatient Hospital Stay (HOSPITAL_COMMUNITY): Payer: BC Managed Care – PPO | Admitting: Anesthesiology

## 2012-05-31 DIAGNOSIS — I714 Abdominal aortic aneurysm, without rupture, unspecified: Principal | ICD-10-CM | POA: Diagnosis present

## 2012-05-31 DIAGNOSIS — E785 Hyperlipidemia, unspecified: Secondary | ICD-10-CM | POA: Diagnosis present

## 2012-05-31 DIAGNOSIS — I739 Peripheral vascular disease, unspecified: Secondary | ICD-10-CM | POA: Diagnosis present

## 2012-05-31 DIAGNOSIS — Z79899 Other long term (current) drug therapy: Secondary | ICD-10-CM

## 2012-05-31 DIAGNOSIS — N400 Enlarged prostate without lower urinary tract symptoms: Secondary | ICD-10-CM | POA: Diagnosis present

## 2012-05-31 DIAGNOSIS — K219 Gastro-esophageal reflux disease without esophagitis: Secondary | ICD-10-CM | POA: Diagnosis present

## 2012-05-31 DIAGNOSIS — Z7982 Long term (current) use of aspirin: Secondary | ICD-10-CM

## 2012-05-31 DIAGNOSIS — I10 Essential (primary) hypertension: Secondary | ICD-10-CM | POA: Diagnosis present

## 2012-05-31 DIAGNOSIS — F430 Acute stress reaction: Secondary | ICD-10-CM | POA: Diagnosis present

## 2012-05-31 HISTORY — PX: ABDOMINAL AORTIC ENDOVASCULAR STENT GRAFT: SHX5707

## 2012-05-31 LAB — BASIC METABOLIC PANEL
BUN: 10 mg/dL (ref 6–23)
CO2: 29 mEq/L (ref 19–32)
GFR calc non Af Amer: 85 mL/min — ABNORMAL LOW (ref >90)
Glucose, Bld: 97 mg/dL (ref 70–99)
Potassium: 4.3 mEq/L (ref 3.5–5.1)
Sodium: 139 mEq/L (ref 135–145)

## 2012-05-31 LAB — APTT: aPTT: 30 seconds (ref 24–37)

## 2012-05-31 LAB — URINALYSIS, ROUTINE W REFLEX MICROSCOPIC
Ketones, ur: NEGATIVE mg/dL
Protein, ur: NEGATIVE mg/dL
Urobilinogen, UA: 0.2 mg/dL (ref 0.0–1.0)

## 2012-05-31 LAB — CBC
HCT: 35.2 % — ABNORMAL LOW (ref 39.0–52.0)
Hemoglobin: 11.8 g/dL — ABNORMAL LOW (ref 13.0–17.0)
MCH: 30.5 pg (ref 26.0–34.0)
MCHC: 33.5 g/dL (ref 30.0–36.0)
RBC: 3.87 MIL/uL — ABNORMAL LOW (ref 4.22–5.81)

## 2012-05-31 LAB — URINE MICROSCOPIC-ADD ON

## 2012-05-31 LAB — URINE CULTURE: Colony Count: >=100000

## 2012-05-31 LAB — PROTIME-INR: INR: 1.15 (ref 0.00–1.49)

## 2012-05-31 SURGERY — INSERTION, ENDOVASCULAR STENT GRAFT, AORTA, ABDOMINAL
Anesthesia: General | Wound class: Clean

## 2012-05-31 MED ORDER — PANTOPRAZOLE SODIUM 40 MG PO TBEC
40.0000 mg | DELAYED_RELEASE_TABLET | Freq: Every day | ORAL | Status: DC
  Administered 2012-06-01: 40 mg via ORAL
  Filled 2012-05-31: qty 1

## 2012-05-31 MED ORDER — GUAIFENESIN-DM 100-10 MG/5ML PO SYRP: 15.0000 mL | ORAL_SOLUTION | ORAL | Status: DC | PRN

## 2012-05-31 MED ORDER — ONDANSETRON HCL 4 MG/2ML IJ SOLN: 4.0000 mg | Freq: Four times a day (QID) | INTRAMUSCULAR | Status: DC | PRN

## 2012-05-31 MED ORDER — LABETALOL HCL 5 MG/ML IV SOLN: 10.0000 mg | INTRAVENOUS | Status: DC | PRN

## 2012-05-31 MED ORDER — DUTASTERIDE 0.5 MG PO CAPS
0.5000 mg | ORAL_CAPSULE | Freq: Every day | ORAL | Status: DC
  Administered 2012-06-01: 0.5 mg via ORAL
  Filled 2012-05-31 (×2): qty 1

## 2012-05-31 MED ORDER — EPHEDRINE SULFATE 50 MG/ML IJ SOLN
INTRAMUSCULAR | Status: DC | PRN
  Administered 2012-05-31: 10 mg via INTRAVENOUS

## 2012-05-31 MED ORDER — ACETAMINOPHEN 650 MG RE SUPP: 325.0000 mg | RECTAL | Status: DC | PRN

## 2012-05-31 MED ORDER — LACTATED RINGERS IV SOLN
INTRAVENOUS | Status: DC | PRN
  Administered 2012-05-31 (×2): via INTRAVENOUS

## 2012-05-31 MED ORDER — OXYCODONE HCL 5 MG PO TABS: 5.0000 mg | ORAL_TABLET | Freq: Four times a day (QID) | ORAL | Status: AC | PRN

## 2012-05-31 MED ORDER — PHENYLEPHRINE HCL 10 MG/ML IJ SOLN
10.0000 mg | INTRAVENOUS | Status: DC | PRN
  Administered 2012-05-31: 60 ug/min via INTRAVENOUS

## 2012-05-31 MED ORDER — DUTASTERIDE 0.5 MG PO CAPS
0.5000 mg | ORAL_CAPSULE | Freq: Every day | ORAL | Status: DC
  Filled 2012-05-31: qty 1

## 2012-05-31 MED ORDER — MIDAZOLAM HCL 2 MG/2ML IJ SOLN: 1.0000 mg | INTRAMUSCULAR | Status: DC | PRN

## 2012-05-31 MED ORDER — ALUM & MAG HYDROXIDE-SIMETH 200-200-20 MG/5ML PO SUSP: 15.0000 mL | ORAL | Status: DC | PRN

## 2012-05-31 MED ORDER — PHENOL 1.4 % MT LIQD: 1.0000 | OROMUCOSAL | Status: DC | PRN

## 2012-05-31 MED ORDER — ATORVASTATIN CALCIUM 40 MG PO TABS
40.0000 mg | ORAL_TABLET | Freq: Every day | ORAL | Status: DC
  Administered 2012-06-01: 40 mg via ORAL
  Filled 2012-05-31 (×2): qty 1

## 2012-05-31 MED ORDER — TAMSULOSIN HCL 0.4 MG PO CAPS
0.4000 mg | ORAL_CAPSULE | Freq: Every day | ORAL | Status: DC
  Filled 2012-05-31: qty 1

## 2012-05-31 MED ORDER — MUPIROCIN 2 % EX OINT
TOPICAL_OINTMENT | Freq: Two times a day (BID) | CUTANEOUS | Status: DC
  Administered 2012-05-31: 1 via NASAL
  Filled 2012-05-31 (×2): qty 22

## 2012-05-31 MED ORDER — LACTATED RINGERS IV SOLN
INTRAVENOUS | Status: DC
  Administered 2012-05-31: 10:00:00 via INTRAVENOUS

## 2012-05-31 MED ORDER — DEXTROSE 5 % IV SOLN
1.5000 g | Freq: Two times a day (BID) | INTRAVENOUS | Status: AC
  Administered 2012-05-31 – 2012-06-01 (×2): 1.5 g via INTRAVENOUS
  Filled 2012-05-31 (×2): qty 1.5

## 2012-05-31 MED ORDER — ROCURONIUM BROMIDE 100 MG/10ML IV SOLN
INTRAVENOUS | Status: DC | PRN
  Administered 2012-05-31 (×2): 5 mg via INTRAVENOUS
  Administered 2012-05-31: 50 mg via INTRAVENOUS
  Administered 2012-05-31: 5 mg via INTRAVENOUS
  Administered 2012-05-31: 10 mg via INTRAVENOUS

## 2012-05-31 MED ORDER — DEXTROSE-NACL 5-0.45 % IV SOLN
INTRAVENOUS | Status: DC
  Administered 2012-05-31: 75 mL/h via INTRAVENOUS
  Administered 2012-06-01: 06:00:00 via INTRAVENOUS

## 2012-05-31 MED ORDER — LACTATED RINGERS IV SOLN
INTRAVENOUS | Status: DC | PRN
  Administered 2012-05-31: 10:00:00 via INTRAVENOUS

## 2012-05-31 MED ORDER — DOCUSATE SODIUM 100 MG PO CAPS
100.0000 mg | ORAL_CAPSULE | Freq: Every day | ORAL | Status: DC
  Administered 2012-06-01: 100 mg via ORAL
  Filled 2012-05-31: qty 1

## 2012-05-31 MED ORDER — DOPAMINE-DEXTROSE 3.2-5 MG/ML-% IV SOLN: 3.0000 ug/kg/min | INTRAVENOUS | Status: DC

## 2012-05-31 MED ORDER — ADULT MULTIVITAMIN W/MINERALS CH
1.0000 | ORAL_TABLET | Freq: Every day | ORAL | Status: DC
  Administered 2012-06-01: 1 via ORAL
  Filled 2012-05-31: qty 1

## 2012-05-31 MED ORDER — OXYCODONE HCL 5 MG PO TABS: 5.0000 mg | ORAL_TABLET | ORAL | Status: DC | PRN

## 2012-05-31 MED ORDER — HYDRALAZINE HCL 20 MG/ML IJ SOLN: 10.0000 mg | INTRAMUSCULAR | Status: DC | PRN

## 2012-05-31 MED ORDER — DUTASTERIDE-TAMSULOSIN HCL 0.5-0.4 MG PO CAPS: 1.0000 | ORAL_CAPSULE | Freq: Every day | ORAL | Status: DC

## 2012-05-31 MED ORDER — TAMSULOSIN HCL 0.4 MG PO CAPS
0.4000 mg | ORAL_CAPSULE | Freq: Every day | ORAL | Status: DC
  Administered 2012-05-31 – 2012-06-01 (×2): 0.4 mg via ORAL
  Filled 2012-05-31 (×2): qty 1

## 2012-05-31 MED ORDER — PROMETHAZINE HCL 25 MG/ML IJ SOLN: 6.2500 mg | INTRAMUSCULAR | Status: DC | PRN

## 2012-05-31 MED ORDER — HYDROMORPHONE HCL PF 1 MG/ML IJ SOLN
0.2500 mg | INTRAMUSCULAR | Status: DC | PRN
  Administered 2012-05-31 (×4): 0.5 mg via INTRAVENOUS

## 2012-05-31 MED ORDER — IODIXANOL 320 MG/ML IV SOLN
INTRAVENOUS | Status: DC | PRN
  Administered 2012-05-31: 150 mL via INTRA_ARTERIAL
  Administered 2012-05-31: 100 mL via INTRA_ARTERIAL
  Administered 2012-05-31: 50 mL via INTRA_ARTERIAL

## 2012-05-31 MED ORDER — PROPOFOL 10 MG/ML IV BOLUS
INTRAVENOUS | Status: DC | PRN
  Administered 2012-05-31: 140 mg via INTRAVENOUS
  Administered 2012-05-31: 100 mg via INTRAVENOUS

## 2012-05-31 MED ORDER — DOXAZOSIN MESYLATE 4 MG PO TABS
4.0000 mg | ORAL_TABLET | Freq: Every day | ORAL | Status: DC
  Administered 2012-06-01: 4 mg via ORAL
  Filled 2012-05-31 (×2): qty 1

## 2012-05-31 MED ORDER — HYDROMORPHONE HCL PF 1 MG/ML IJ SOLN
INTRAMUSCULAR | Status: AC
  Filled 2012-05-31: qty 1

## 2012-05-31 MED ORDER — 0.9 % SODIUM CHLORIDE (POUR BTL) OPTIME
TOPICAL | Status: DC | PRN
  Administered 2012-05-31: 1000 mL

## 2012-05-31 MED ORDER — MORPHINE SULFATE 2 MG/ML IJ SOLN: 2.0000 mg | INTRAMUSCULAR | Status: DC | PRN

## 2012-05-31 MED ORDER — POTASSIUM CHLORIDE CRYS ER 20 MEQ PO TBCR: 20.0000 meq | EXTENDED_RELEASE_TABLET | Freq: Once | ORAL | Status: AC | PRN

## 2012-05-31 MED ORDER — METOPROLOL TARTRATE 1 MG/ML IV SOLN: 2.0000 mg | INTRAVENOUS | Status: DC | PRN

## 2012-05-31 MED ORDER — LIDOCAINE HCL 4 % MT SOLN
OROMUCOSAL | Status: DC | PRN
  Administered 2012-05-31: 4 mL via TOPICAL

## 2012-05-31 MED ORDER — ACETAMINOPHEN 325 MG PO TABS
325.0000 mg | ORAL_TABLET | ORAL | Status: DC | PRN
  Administered 2012-06-01 (×2): 650 mg via ORAL
  Filled 2012-05-31 (×2): qty 2

## 2012-05-31 MED ORDER — ASPIRIN EC 81 MG PO TBEC
81.0000 mg | DELAYED_RELEASE_TABLET | Freq: Every day | ORAL | Status: DC
  Administered 2012-06-01: 81 mg via ORAL
  Filled 2012-05-31: qty 1

## 2012-05-31 MED ORDER — ZOLPIDEM TARTRATE 5 MG PO TABS: 5.0000 mg | ORAL_TABLET | Freq: Every evening | ORAL | Status: DC | PRN

## 2012-05-31 MED ORDER — SODIUM CHLORIDE 0.9 % IV SOLN: 500.0000 mL | Freq: Once | INTRAVENOUS | Status: AC | PRN

## 2012-05-31 MED ORDER — FENTANYL CITRATE 0.05 MG/ML IJ SOLN: 50.0000 ug | Freq: Once | INTRAMUSCULAR | Status: DC

## 2012-05-31 MED ORDER — HEPARIN SODIUM (PORCINE) 1000 UNIT/ML IJ SOLN
INTRAMUSCULAR | Status: DC | PRN
  Administered 2012-05-31: 5000 [IU] via INTRAVENOUS

## 2012-05-31 MED ORDER — LIDOCAINE HCL (CARDIAC) 20 MG/ML IV SOLN
INTRAVENOUS | Status: DC | PRN
  Administered 2012-05-31: 80 mg via INTRAVENOUS

## 2012-05-31 MED ORDER — SODIUM CHLORIDE 0.9 % IR SOLN
Status: DC | PRN
  Administered 2012-05-31: 11:00:00

## 2012-05-31 SURGICAL SUPPLY — 88 items
BAG BANDED W/RUBBER/TAPE 36X54 (MISCELLANEOUS) ×2 IMPLANT
BAG DECANTER FOR FLEXI CONT (MISCELLANEOUS) IMPLANT
BAG SNAP BAND KOVER 36X36 (MISCELLANEOUS) ×6 IMPLANT
BALLN CODA OCL 2-9.0-35-120-3 (BALLOONS)
BALLOON COD OCL 2-9.0-35-120-3 (BALLOONS) IMPLANT
BENZOIN TINCTURE PRP APPL 2/3 (GAUZE/BANDAGES/DRESSINGS) ×2 IMPLANT
CANISTER SUCTION 2500CC (MISCELLANEOUS) ×2 IMPLANT
CATH BEACON 5.038 65CM KMP-01 (CATHETERS) ×2 IMPLANT
CATH COUDE FOLEY 2W 5CC 18FR (CATHETERS) ×2 IMPLANT
CATH HEADHUNTER 5FR 65CM (MISCELLANEOUS) ×2 IMPLANT
CATH OMNI FLUSH .035X70CM (CATHETERS) ×2 IMPLANT
CLIP LIGATING EXTRA MED SLVR (CLIP) IMPLANT
CLIP LIGATING EXTRA SM BLUE (MISCELLANEOUS) IMPLANT
CLOTH BEACON ORANGE TIMEOUT ST (SAFETY) ×2 IMPLANT
COVER DOME SNAP 22 D (MISCELLANEOUS) ×2 IMPLANT
COVER MAYO STAND STRL (DRAPES) ×2 IMPLANT
COVER PROBE W GEL 5X96 (DRAPES) ×2 IMPLANT
COVER SURGICAL LIGHT HANDLE (MISCELLANEOUS) ×2 IMPLANT
DEVICE CLOSURE PERCLS PRGLD 6F (VASCULAR PRODUCTS) ×4 IMPLANT
DRAIN CHANNEL 10F 3/8 F FF (DRAIN) IMPLANT
DRAIN CHANNEL 10M FLAT 3/4 FLT (DRAIN) IMPLANT
DRAPE TABLE COVER HEAVY DUTY (DRAPES) ×4 IMPLANT
DRESSING OPSITE X SMALL 2X3 (GAUZE/BANDAGES/DRESSINGS) ×4 IMPLANT
DRYSEAL FLEXSHEATH 16FR 33CM (SHEATH) ×1
DRYSEAL FLEXSHEATH 18FR 33CM (SHEATH) ×1
ELECT CAUTERY BLADE 6.4 (BLADE) ×2 IMPLANT
ELECT REM PT RETURN 9FT ADLT (ELECTROSURGICAL) ×4
ELECTRODE REM PT RTRN 9FT ADLT (ELECTROSURGICAL) ×2 IMPLANT
EVACUATOR 3/16  PVC DRAIN (DRAIN)
EVACUATOR 3/16 PVC DRAIN (DRAIN) IMPLANT
EVACUATOR SILICONE 100CC (DRAIN) IMPLANT
EXCLUDER TRUNK (Endovascular Graft) ×2 IMPLANT
GAUZE SPONGE 2X2 8PLY STRL LF (GAUZE/BANDAGES/DRESSINGS) ×1 IMPLANT
GLOVE BIO SURGEON STRL SZ 6.5 (GLOVE) ×4 IMPLANT
GLOVE BIOGEL PI IND STRL 6.5 (GLOVE) ×2 IMPLANT
GLOVE BIOGEL PI IND STRL 7.0 (GLOVE) ×2 IMPLANT
GLOVE BIOGEL PI INDICATOR 6.5 (GLOVE) ×2
GLOVE BIOGEL PI INDICATOR 7.0 (GLOVE) ×2
GLOVE SS BIOGEL STRL SZ 7.5 (GLOVE) ×1 IMPLANT
GLOVE SUPERSENSE BIOGEL SZ 7.5 (GLOVE) ×1
GLOVE SURG SS PI 6.5 STRL IVOR (GLOVE) ×4 IMPLANT
GLOVE SURG SS PI 7.0 STRL IVOR (GLOVE) ×6 IMPLANT
GOWN STRL NON-REIN LRG LVL3 (GOWN DISPOSABLE) ×8 IMPLANT
GOWN STRL REIN XL XLG (GOWN DISPOSABLE) ×4 IMPLANT
GRAFT BALLN CATH 65CM (STENTS) ×1 IMPLANT
GRAFT EXCLUD AORTIC (Endovascular Graft) ×2 IMPLANT
H R LUBE JELLY XXX (MISCELLANEOUS) ×2 IMPLANT
KIT BASIN OR (CUSTOM PROCEDURE TRAY) ×2 IMPLANT
KIT ROOM TURNOVER OR (KITS) ×2 IMPLANT
LEG CONTRALATERAL 27X14 (Vascular Products) ×2 IMPLANT
LEG CONTRALETERAL16X16X11.5 (Endovascular Graft) ×1 IMPLANT
MARKER SKIN DUAL TIP RULER LAB (MISCELLANEOUS) ×2 IMPLANT
NEEDLE PERC 18GX7CM (NEEDLE) ×2 IMPLANT
NS IRRIG 1000ML POUR BTL (IV SOLUTION) ×2 IMPLANT
PACK AORTA (CUSTOM PROCEDURE TRAY) ×2 IMPLANT
PAD ARMBOARD 7.5X6 YLW CONV (MISCELLANEOUS) ×4 IMPLANT
PENCIL BUTTON HOLSTER BLD 10FT (ELECTRODE) ×2 IMPLANT
PERCLOSE PROGLIDE 6F (VASCULAR PRODUCTS) ×8
PROTECTION STATION PRESSURIZED (MISCELLANEOUS) ×2
SHEATH AVANTI 11CM 8FR (MISCELLANEOUS) ×2 IMPLANT
SHEATH BRITE TIP 8FR 23CM (MISCELLANEOUS) ×2 IMPLANT
SHEATH DRYSEAL FLEX 16FR 33CM (SHEATH) ×1 IMPLANT
SHEATH DRYSEAL FLEX 18FR 33CM (SHEATH) ×1 IMPLANT
SPONGE GAUZE 2X2 STER 10/PKG (GAUZE/BANDAGES/DRESSINGS) ×1
STAPLER VISISTAT 35W (STAPLE) IMPLANT
STATION PROTECTION PRESSURIZED (MISCELLANEOUS) ×1 IMPLANT
STENT GRAFT BALLN CATH 65CM (STENTS) ×1
STENT GRAFT CONTRALAT 16X11.5 (Endovascular Graft) ×1 IMPLANT
STOPCOCK MORSE 400PSI 3WAY (MISCELLANEOUS) ×2 IMPLANT
STRIP CLOSURE SKIN 1/2X4 (GAUZE/BANDAGES/DRESSINGS) ×2 IMPLANT
SUT ETHILON 3 0 PS 1 (SUTURE) IMPLANT
SUT PROLENE 5 0 C 1 24 (SUTURE) IMPLANT
SUT VIC AB 2-0 CTX 36 (SUTURE) IMPLANT
SUT VIC AB 3-0 SH 18 (SUTURE) IMPLANT
SUT VIC AB 3-0 SH 27 (SUTURE)
SUT VIC AB 3-0 SH 27X BRD (SUTURE) IMPLANT
SUT VICRYL 4-0 PS2 18IN ABS (SUTURE) ×4 IMPLANT
SYR 20CC LL (SYRINGE) ×4 IMPLANT
SYR 30ML LL (SYRINGE) IMPLANT
SYR 5ML LL (SYRINGE) ×2 IMPLANT
SYR MEDRAD MARK V 150ML (SYRINGE) ×2 IMPLANT
SYRINGE 10CC LL (SYRINGE) ×6 IMPLANT
TOWEL OR 17X24 6PK STRL BLUE (TOWEL DISPOSABLE) ×4 IMPLANT
TOWEL OR 17X26 10 PK STRL BLUE (TOWEL DISPOSABLE) ×4 IMPLANT
TRAY FOLEY CATH 14FRSI W/METER (CATHETERS) ×2 IMPLANT
TUBING HIGH PRESSURE 120CM (CONNECTOR) ×2 IMPLANT
WIRE AMPLATZ SS-J .035X180CM (WIRE) ×4 IMPLANT
WIRE BENTSON .035X145CM (WIRE) ×4 IMPLANT

## 2012-05-31 NOTE — Transfer of Care (Signed)
Immediate Anesthesia Transfer of Care Note  Patient: Jonathan Levine  Procedure(s) Performed: Procedure(s) with comments: ABDOMINAL AORTIC ENDOVASCULAR STENT GRAFT (N/A) - GORE; Ultrasound guided  Patient Location: PACU  Anesthesia Type:General  Level of Consciousness: awake  Airway & Oxygen Therapy: Patient Spontanous Breathing  Post-op Assessment: Report given to PACU RN  Post vital signs: Reviewed and stable  Complications: No apparent anesthesia complications

## 2012-05-31 NOTE — Anesthesia Procedure Notes (Signed)
Procedure Name: Intubation Date/Time: 05/31/2012 10:35 AM Performed by: Coralee Rud Pre-anesthesia Checklist: Patient identified, Emergency Drugs available, Suction available and Patient being monitored Patient Re-evaluated:Patient Re-evaluated prior to inductionOxygen Delivery Method: Circle system utilized Preoxygenation: Pre-oxygenation with 100% oxygen Intubation Type: IV induction Ventilation: Mask ventilation without difficulty Laryngoscope Size: Miller and 3 Tube type: Oral Tube size: 8.0 mm Number of attempts: 1 Airway Equipment and Method: Stylet Placement Confirmation: ETT inserted through vocal cords under direct vision and positive ETCO2 Secured at: 2 cm Tube secured with: Tape Dental Injury: Teeth and Oropharynx as per pre-operative assessment

## 2012-05-31 NOTE — Interval H&P Note (Signed)
History and Physical Interval Note:  05/31/2012 10:13 AM  Jonathan Levine  has presented today for surgery, with the diagnosis of Abdominal Aortic Aneurysm  The various methods of treatment have been discussed with the patient and family. After consideration of risks, benefits and other options for treatment, the patient has consented to  Procedure(s) with comments: ABDOMINAL AORTIC ENDOVASCULAR STENT GRAFT (N/A) - EVAR AAA/ [name] as a surgical intervention .  The patient's history has been reviewed, patient examined, no change in status, stable for surgery.  I have reviewed the patient's chart and labs.  Questions were answered to the patient's satisfaction.     [name]

## 2012-05-31 NOTE — H&P (View-Only) (Signed)
The patient presents today for followup in continued discussion of his 6.6 cm infrarenal abdominal aortic aneurysm. We have discussed this last week and he is here today with his wife for continued discussion. He has seen Dr.Nishan for preop cardiac evaluation and I have seen the results of his Cardiolite. He has no change in his physical exam.  Past Medical History  Diagnosis Date  . GERD (gastroesophageal reflux disease)   . AAA (abdominal aortic aneurysm)   . BPH (benign prostatic hypertrophy)   . Arthritis   . Hypertension   . Hyperlipidemia   . H/O cholelithiasis     History  Substance Use Topics  . Smoking status: Current Every Day Smoker -- 1.00 packs/day  . Smokeless tobacco: Not on file  . Alcohol Use: No    Family History  Problem Relation Age of Onset  . Heart disease Mother     had pacemaker  . Kidney disease Mother     Allergies  Allergen Reactions  . Codeine     Halllucination    Current outpatient prescriptions:aspirin 81 MG tablet, Take 81 mg by mouth daily., Disp: , Rfl: ;  atorvastatin (LIPITOR) 40 MG tablet, Take 40 mg by mouth daily., Disp: , Rfl: ;  doxazosin (CARDURA) 4 MG tablet, Take 4 mg by mouth at bedtime., Disp: , Rfl: ;  Dutasteride-Tamsulosin HCl (JALYN) 0.5-0.4 MG CAPS, Take by mouth daily., Disp: , Rfl: ;  Multiple Vitamin (MULTIVITAMIN) tablet, Take 1 tablet by mouth daily., Disp: , Rfl:  Omega-3 Fatty Acids (FISH OIL PO), Take by mouth., Disp: , Rfl: ;  pantoprazole (PROTONIX) 40 MG tablet, Take 40 mg by mouth daily., Disp: , Rfl: ;  Tadalafil (CIALIS PO), Take by mouth., Disp: , Rfl:   BP 124/70  Pulse 68  Ht 6' (1.829 m)  Wt 159 lb (72.122 kg)  BMI 21.56 kg/m2  SpO2 98%  Body mass index is 21.56 kg/(m^2).       I again reviewed his CT scan with the patient and his wife. I discussed the option of open aneurysm repair and stent graft repair. I explained that I reviewed his films with the device manufacturer's last visit with him 1  week ago. I explained that this could have a flared right limb of his stent graft that would that not require coiling of his hypogastric aneurysm. I explained the benefits and disadvantage of this option and would recommend this if we proceed with stent graft repair. He and his wife both want to proceed with stent grafting and we will do this at his earliest convenience. We will confirm that his cardiac status is stable and we'll plan for stent graft repair next week.

## 2012-05-31 NOTE — Progress Notes (Signed)
Patient received from PACU alert and oriented, patient able to transfer self into bed. Patient oriented to unit and instructed not to get out of the bed. Wife present and settled in the unit. Left groin dressing upon arrival to unit is saturated with fresh blood, have reinforced the dressing and will continue to observe it.

## 2012-05-31 NOTE — Anesthesia Postprocedure Evaluation (Signed)
  Anesthesia Post-op Note  Patient: Jonathan Levine  Procedure(s) Performed: Procedure(s) with comments: ABDOMINAL AORTIC ENDOVASCULAR STENT GRAFT (N/A) - GORE; Ultrasound guided  Patient Location: PACU  Anesthesia Type:General  Level of Consciousness: awake  Airway and Oxygen Therapy: Patient Spontanous Breathing  Post-op Pain: mild  Post-op Assessment: Post-op Vital signs reviewed  Post-op Vital Signs: stable  Complications: No apparent anesthesia complications

## 2012-05-31 NOTE — Op Note (Signed)
OPERATIVE REPORT  DATE OF SURGERY: 05/31/2012  PATIENT: Jonathan Levine, 72 y.o. male MRN: [id number]  DOB: [birth year]  PRE-OPERATIVE DIAGNOSIS: 6.6 cm abdominal aortic aneurysm  POST-OPERATIVE DIAGNOSIS:  Same  PROCEDURE: Gore stent graft repair of abdominal aortic aneurysm  SURGEON:  Gretta Began, M.D.  PHYSICIAN ASSISTANT: Rhyne  ANESTHESIA:  Gen.  EBL: 100 ml  Total I/O In: 1500 [P.O.:500; I.V.:1000] Out: 475 [Urine:375; Blood:100]  BLOOD ADMINISTERED: None  DRAINS: None  SPECIMEN:  None  COUNTS CORRECT:  YES  PLAN OF CARE: PACU   PATIENT DISPOSITION:  PACU - hemodynamically stable  PROCEDURE DETAILS: The patient was taken up replacing position where the area of the abdomen both groins are prepped and draped in usual sterile fashion. Using ultrasound imaging the common femoral arteries were imaged bilaterally and using an 18-gauge needle the Seldinger technique guidewire was passed through the common femoral artery centrally.Two Perclose devices were placed over the guidewire at the 10:00 and 2:00 position for eventual percutaneous closure at the end of the procedure. 8 French sheath were passed over the guidewires bilaterally. The incision wires were exchanged for Amplatz superstiff wires bilaterally by first placing a Kumpe catheter then exchanging these. A 18 French sheath was passed over the left guidewire and along 16 sheath was passed over the right guidewire. The main body device was a 24 x 18 x 12 cm. This was passed through the left sheath and positioned at the level of the renal arteries. A marker pigtail catheter is positioned to the right sheath at the level of renal arteries and an AP projection arteriogram was obtained showing the level of the renal arteries. The main body of the device was deployed at this level just below the takeoff of the renal arteries. There was some angulation of the aorta and there was some bird beaking in the left wall of the aorta. Next  the pigtail catheter was pulled down into the aortic sac and using exchanged for an H1 guiding catheter and Bentson wire was passed through the contralateral gate. The pigtail catheter is positioned in the main body of the device and and twisted to confirm that this was indeed in the main body. A contralateral limb which flared at the lower end was a 27 x 14 cm device. The retrograde injection through the right femoral sheath revealed the level of the hypogastric artery takeoff. The contralateral leg was deployed just above the level of the hypogastric takeoff. Next the iliac extender on the left was chosen. Again retrograde injection through the left appropriate placement and a 16 x 11 half centimeter iliac extender was placed just above the level of the hypogastric takeoff. Repeat arteriograms at the level renal artery showed some concern for a type I endoleak due to the incomplete apposition at the proximal anastomosis. This was after a ballooning of the proximal distal and junction. For this reason a aortic extender which was 28 x 3.3 cm was positioned and deployed. This rested better over the angulation of the aorta. This was dilated with a balloon as well. Final arteriogram showed no evidence of endoleak and excellent positioning with good flow into both hypogastric arteries. The patient had been given 5000 units of heparin prior to the placement of a large sheath. This was reversed after closure of the groins. The right sheath was pulled and pressure was held for hemostasis. The Perclose device sutures were secured getting good hemostasis. Similarly on the left and the left sheath was pulled again hemostasis  obtained initially with direct pressure and then the 2 Perclose devices were deployed getting good hemostasis. Patient's was given protamine to reverse the heparin. The wounds were closed with a single 4-0 subcuticular Vicryl stitch. Sterile dressing was applied. The patient had palpable dorsalis pedis  pulses and was transferred to the recovery room in stable condition   Gretta Began, M.D. 05/31/2012 4:51 PM

## 2012-05-31 NOTE — Anesthesia Preprocedure Evaluation (Addendum)
Anesthesia Evaluation  Patient identified by MRN, date of birth, ID band Patient awake    Reviewed: Allergy & Precautions, H&P , NPO status , Patient's Chart, lab work & pertinent test results  Airway Mallampati: I TM Distance: >3 FB Neck ROM: Full    Dental  (+) Edentulous Upper and Edentulous Lower   Pulmonary sleep apnea ,  + rhonchi         Cardiovascular hypertension, + Peripheral Vascular Disease Rhythm:Regular Rate:Normal     Neuro/Psych    GI/Hepatic GERD-  ,  Endo/Other    Renal/GU      Musculoskeletal   Abdominal   Peds  Hematology   Anesthesia Other Findings   Reproductive/Obstetrics                          Anesthesia Physical Anesthesia Plan  ASA: III  Anesthesia Plan: General   Post-op Pain Management:    Induction: Intravenous  Airway Management Planned: Oral ETT  Additional Equipment: Arterial line  Intra-op Plan:   Post-operative Plan: Extubation in OR  Informed Consent: I have reviewed the patients History and Physical, chart, labs and discussed the procedure including the risks, benefits and alternatives for the proposed anesthesia with the patient or authorized representative who has indicated his/her understanding and acceptance.     Plan Discussed with: CRNA and Surgeon  Anesthesia Plan Comments:        Anesthesia Quick Evaluation

## 2012-06-01 ENCOUNTER — Other Ambulatory Visit: Payer: Self-pay | Admitting: *Deleted

## 2012-06-01 DIAGNOSIS — Z48812 Encounter for surgical aftercare following surgery on the circulatory system: Secondary | ICD-10-CM

## 2012-06-01 DIAGNOSIS — I714 Abdominal aortic aneurysm, without rupture: Secondary | ICD-10-CM

## 2012-06-01 LAB — CBC
HCT: 32.6 % — ABNORMAL LOW (ref 39.0–52.0)
Hemoglobin: 11.1 g/dL — ABNORMAL LOW (ref 13.0–17.0)
MCH: 31 pg (ref 26.0–34.0)
MCHC: 34 g/dL (ref 30.0–36.0)
RDW: 13.9 % (ref 11.5–15.5)

## 2012-06-01 LAB — URINE CULTURE

## 2012-06-01 LAB — BASIC METABOLIC PANEL
BUN: 8 mg/dL (ref 6–23)
Calcium: 8.6 mg/dL (ref 8.4–10.5)
GFR calc Af Amer: >90 mL/min (ref >90)
GFR calc non Af Amer: 85 mL/min — ABNORMAL LOW (ref >90)
Glucose, Bld: 108 mg/dL — ABNORMAL HIGH (ref 70–99)
Potassium: 4 mEq/L (ref 3.5–5.1)

## 2012-06-01 NOTE — Progress Notes (Signed)
Utilization review completed.  

## 2012-06-01 NOTE — Progress Notes (Signed)
After removing patient's foley, pt. Voided about 25 cc of bloody urine; will continue to monitor.  Does have a history of BPH and takes Flomax.  Vivi Martens  RN

## 2012-06-01 NOTE — Progress Notes (Signed)
Shadowing noticed on Right groin site dressing; marked; site soft; will continue to monitor.  Suzan Slick Advice worker

## 2012-06-01 NOTE — Discharge Summary (Signed)
[facility] Discharge Summary  Jonathan Levine [birth year] 72 y.o. male  [id number]  Admission Date: 05/31/2012  Discharge Date: 06/01/12  Physician: Larina Earthly, MD  Admission Diagnosis: Abdominal Aortic Aneurysm   HPI:   This is a 72 y.o. male patient presents today for followup in continued discussion of his 6.6 cm infrarenal abdominal aortic aneurysm. We have discussed this last week and he is here today with his wife for continued discussion. He has seen Dr.Nishan for preop cardiac evaluation and I have seen the results of his Cardiolite. He has no change in his physical exam.   Hospital Course:  The patient was admitted to the hospital and taken to the operating room on 05/31/2012 and underwent  Gore stent graft repair of abdominal aortic aneurysm    The pt tolerated the procedure well and was transported to the PACU in good condition. By POD 1, his foley was removed and the pt was voiding and ambulating without difficulty.  He did have bilateral pedal pulses.  Bilateral groins are soft without hematoma.  The remainder of the hospital course consisted of increasing mobilization and increasing intake of solids without difficulty.  CBC    Component Value Date/Time   WBC 10.0 06/01/2012 0449   RBC 3.58* 06/01/2012 0449   HGB 11.1* 06/01/2012 0449   HCT 32.6* 06/01/2012 0449   PLT 140* 06/01/2012 0449   MCV 91.1 06/01/2012 0449   MCH 31.0 06/01/2012 0449   MCHC 34.0 06/01/2012 0449   RDW 13.9 06/01/2012 0449    BMET    Component Value Date/Time   NA 136 06/01/2012 0449   K 4.0 06/01/2012 0449   CL 102 06/01/2012 0449   CO2 28 06/01/2012 0449   GLUCOSE 108* 06/01/2012 0449   BUN 8 06/01/2012 0449   CREATININE 0.89 06/01/2012 0449   CREATININE 1.01 05/15/2012 1236   CALCIUM 8.6 06/01/2012 0449   GFRNONAA 85* 06/01/2012 0449   GFRAA >90 06/01/2012 0449     Discharge Instructions:   The patient is discharged to home with extensive instructions on wound care and  progressive ambulation.  They are instructed not to drive or perform any heavy lifting until returning to see the physician in his office.  Discharge Orders   Future Orders Complete By Expires     ABDOMINAL PROCEDURE/ANEURYSM REPAIR/AORTO-BIFEMORAL BYPASS:  Call MD for increased abdominal pain; cramping diarrhea; nausea/vomiting  As directed     Call MD for:  redness, tenderness, or signs of infection (pain, swelling, bleeding, redness, odor or green/yellow discharge around incision site)  As directed     Call MD for:  severe or increased pain, loss or decreased feeling  in affected limb(s)  As directed     Call MD for:  temperature >100.5  As directed     Discharge wound care:  As directed     Comments:      Shower daily with soap and water starting 06/02/12    Driving Restrictions  As directed     Comments:      No driving for 2 weeks    Lifting restrictions  As directed     Comments:      No lifting for 6 weeks    Resume previous diet  As directed        Discharge Diagnosis:  Abdominal Aortic Aneurysm  Secondary Diagnosis: Patient Active Problem List  Diagnosis  . Abdominal aneurysm without mention of rupture  . GERD (gastroesophageal reflux disease)  .  BPH (benign prostatic hypertrophy)  . Arthritis  . Hypertension  . Hyperlipidemia  . H/O cholelithiasis  . Preop cardiovascular exam   Past Medical History  Diagnosis Date  . GERD (gastroesophageal reflux disease)   . AAA (abdominal aortic aneurysm)   . BPH (benign prostatic hypertrophy)   . Arthritis   . Hypertension   . Hyperlipidemia   . H/O cholelithiasis   . Anxiety     situational- surgery  . Sleep apnea        Medication List    TAKE these medications       aspirin 81 MG tablet  Take 81 mg by mouth daily.     atorvastatin 40 MG tablet  Commonly known as:  LIPITOR  Take 40 mg by mouth daily.     doxazosin 4 MG tablet  Commonly known as:  CARDURA  Take 4 mg by mouth daily.     FISH OIL PO   Take 1 capsule by mouth daily.     JALYN 0.5-0.4 MG Caps  Generic drug:  Dutasteride-Tamsulosin HCl  Take 1 capsule by mouth daily.     multivitamin tablet  Take 1 tablet by mouth daily.     oxyCODONE 5 MG immediate release tablet  Commonly known as:  ROXICODONE  Take 1 tablet (5 mg total) by mouth every 6 (six) hours as needed for pain.     pantoprazole 40 MG tablet  Commonly known as:  PROTONIX  Take 40 mg by mouth daily.       #30  Roxicodone given without refill  Disposition: home  Patient's condition: is Good  Follow up: 1. Dr. Arbie Cookey in 4 weeks with CTA   Doreatha Massed, PA-C [facility] [phone] 06/01/2012  7:38 AM

## 2012-06-01 NOTE — Progress Notes (Signed)
Patient being discharged per MD order. All instructions gone over with patient and wife, verbalized understanding.Patient alert and oriented. All vital signs WNL.

## 2012-06-01 NOTE — Progress Notes (Addendum)
[facility] Progress Note  06/01/2012 7:33 AM POD 1  Subjective:  No complaints.  Ready to go home.  Tm 99.5 HR 50-90's Systolic 100-130's  93% RA  Filed Vitals:   06/01/12 0400  BP: 106/66  Pulse: 94  Temp: 99.5 F (37.5 C)  Resp: 18     Physical Exam: Cardiac:  regular Lungs:  Non labored Abdomen:  Soft/NT/ND Incisions:  Bilateral groins are soft without hematoma Extremities:  Bilateral palpable DP; both feet are warm and well perfused.  CBC    Component Value Date/Time   WBC 10.0 06/01/2012 0449   RBC 3.58* 06/01/2012 0449   HGB 11.1* 06/01/2012 0449   HCT 32.6* 06/01/2012 0449   PLT 140* 06/01/2012 0449   MCV 91.1 06/01/2012 0449   MCH 31.0 06/01/2012 0449   MCHC 34.0 06/01/2012 0449   RDW 13.9 06/01/2012 0449    BMET    Component Value Date/Time   NA 136 06/01/2012 0449   K 4.0 06/01/2012 0449   CL 102 06/01/2012 0449   CO2 28 06/01/2012 0449   GLUCOSE 108* 06/01/2012 0449   BUN 8 06/01/2012 0449   CREATININE 0.89 06/01/2012 0449   CREATININE 1.01 05/15/2012 1236   CALCIUM 8.6 06/01/2012 0449   GFRNONAA 85* 06/01/2012 0449   GFRAA >90 06/01/2012 0449    INR    Component Value Date/Time   INR 1.15 05/31/2012 1400     Intake/Output Summary (Last 24 hours) at 06/01/12 1610 Last data filed at 06/01/12 0500  Gross per 24 hour  Intake 2462.5 ml  Output   1975 ml  Net  487.5 ml     Assessment/Plan:  72 y.o. male is s/p Gore stent graft repair of abdominal aortic aneurysm  POD 1  -pt doing well this am-he has voided as well as ambulated -BUN/Cr WNL and good UOP -discharge home -f/u with Dr. Arbie Cookey in 4 weeks with CTA   Doreatha Massed, PA-C [facility] [phone] 06/01/2012 7:33 AM  I have examined the patient, reviewed and agree with above.  EARLY, TODD, MD 06/01/2012 8:16 AM

## 2012-06-02 ENCOUNTER — Encounter (HOSPITAL_COMMUNITY): Payer: Self-pay | Admitting: Vascular Surgery

## 2012-06-02 ENCOUNTER — Other Ambulatory Visit: Payer: Self-pay | Admitting: *Deleted

## 2012-06-02 DIAGNOSIS — N390 Urinary tract infection, site not specified: Secondary | ICD-10-CM

## 2012-06-02 MED ORDER — AMOXICILLIN-POT CLAVULANATE 875-125 MG PO TABS: 1.0000 | ORAL_TABLET | Freq: Two times a day (BID) | ORAL | Status: AC

## 2012-06-02 NOTE — Progress Notes (Signed)
Patient's UA grew out Group B Strep, Per Dr. Bosie Helper verbal order I called in Augmentin 875-125mg  BID x 7 days to [facility] in [place] [phone]). I discussed this plan with Mrs. Eichhorn and she will go now to pick up Rx for her husband. She reports that he had a fever of 100 this am but it has gone down since she gave him some Tylenol.  She voices understanding of this Rx plan.

## 2012-06-06 MED ORDER — FENTANYL CITRATE 0.05 MG/ML IJ SOLN
INTRAMUSCULAR | Status: DC | PRN
  Administered 2012-05-31: 100 ug via INTRAVENOUS

## 2012-06-06 MED ORDER — MIDAZOLAM HCL 5 MG/5ML IJ SOLN
INTRAMUSCULAR | Status: DC | PRN
  Administered 2012-05-31: 2 mg via INTRAVENOUS

## 2012-06-06 NOTE — Addendum Note (Signed)
Addendum created 06/06/12 1558 by Coralee Rud, CRNA   Modules edited: Anesthesia Medication Administration

## 2012-06-20 ENCOUNTER — Other Ambulatory Visit: Payer: Self-pay | Admitting: *Deleted

## 2012-07-03 ENCOUNTER — Encounter: Payer: Self-pay | Admitting: Vascular Surgery

## 2012-07-04 ENCOUNTER — Ambulatory Visit (INDEPENDENT_AMBULATORY_CARE_PROVIDER_SITE_OTHER): Payer: BC Managed Care – PPO | Admitting: Vascular Surgery

## 2012-07-04 ENCOUNTER — Encounter: Payer: Self-pay | Admitting: Vascular Surgery

## 2012-07-04 ENCOUNTER — Ambulatory Visit
Admission: RE | Admit: 2012-07-04 | Discharge: 2012-07-04 | Disposition: A | Discharge status: Home / Self Care | Payer: BC Managed Care – PPO | Source: Ambulatory Visit | Attending: Vascular Surgery | Admitting: Vascular Surgery

## 2012-07-04 VITALS — BP 132/90 | HR 66 | Ht 72.0 in | Wt 159.0 lb

## 2012-07-04 DIAGNOSIS — Z48812 Encounter for surgical aftercare following surgery on the circulatory system: Secondary | ICD-10-CM

## 2012-07-04 DIAGNOSIS — I714 Abdominal aortic aneurysm, without rupture, unspecified: Secondary | ICD-10-CM | POA: Insufficient documentation

## 2012-07-04 MED ORDER — IOHEXOL 350 MG/ML SOLN
100.0000 mL | Freq: Once | INTRAVENOUS | Status: AC | PRN
  Administered 2012-07-04: 100 mL via INTRAVENOUS

## 2012-07-04 NOTE — Addendum Note (Signed)
Addended by: Adria Dill L on: 07/04/2012 04:44 PM   Modules accepted: Orders

## 2012-07-04 NOTE — Progress Notes (Signed)
Patient has today for followup of stent graft repair of abdominal aortic aneurysm on 09/29/2012. He has done well since his surgery and is returned to his usual activities including playing golf. He did have one episode where he had some upper epigastric pain and diaphoresis which resolved quickly. He had a preop cardiac evaluation which was negative for any significant occlusive disease. We will continue to monitor this. He also has orthostatic hypotension which is chronic. No focal neurologic deficits.  On physical exam his groin puncture sites are without any evidence of false aneurysm. Abdomen is soft nontender no aneurysm is palpable  CT scan today reveals excellent positioning of the stent graft. Maximal diameter shrunk to 6.4 from the maximal 6.6 cm prior to the procedure. He has no evidence of endoleak  Impression and plan excellent one month followup following stent graft repair of infrarenal abdominal aortic aneurysm. Patient continues we'll activities. We will see him in 6 months with repeat CT scan

## 2013-01-08 ENCOUNTER — Other Ambulatory Visit: Payer: Self-pay | Admitting: Vascular Surgery

## 2013-01-09 ENCOUNTER — Ambulatory Visit
Admission: RE | Admit: 2013-01-09 | Discharge: 2013-01-09 | Disposition: A | Discharge status: Home / Self Care | Payer: BC Managed Care – PPO | Source: Ambulatory Visit | Attending: Vascular Surgery | Admitting: Vascular Surgery

## 2013-01-09 ENCOUNTER — Ambulatory Visit: Payer: BC Managed Care – PPO | Admitting: Vascular Surgery

## 2013-01-09 DIAGNOSIS — Z48812 Encounter for surgical aftercare following surgery on the circulatory system: Secondary | ICD-10-CM

## 2013-01-09 DIAGNOSIS — I714 Abdominal aortic aneurysm, without rupture: Secondary | ICD-10-CM

## 2013-01-09 MED ORDER — IOHEXOL 350 MG/ML SOLN
100.0000 mL | Freq: Once | INTRAVENOUS | Status: AC | PRN
  Administered 2013-01-09: 100 mL via INTRAVENOUS

## 2013-01-22 ENCOUNTER — Encounter: Payer: Self-pay | Admitting: Vascular Surgery

## 2013-01-23 ENCOUNTER — Encounter: Payer: Self-pay | Admitting: Vascular Surgery

## 2013-01-23 ENCOUNTER — Ambulatory Visit (INDEPENDENT_AMBULATORY_CARE_PROVIDER_SITE_OTHER): Payer: BC Managed Care – PPO | Admitting: Vascular Surgery

## 2013-01-23 VITALS — BP 148/75 | HR 71 | Resp 18 | Ht 72.0 in | Wt 156.9 lb

## 2013-01-23 DIAGNOSIS — I714 Abdominal aortic aneurysm, without rupture, unspecified: Secondary | ICD-10-CM

## 2013-01-23 DIAGNOSIS — Z48812 Encounter for surgical aftercare following surgery on the circulatory system: Secondary | ICD-10-CM

## 2013-01-23 NOTE — Addendum Note (Signed)
Addended by: Adria Dill L on: 01/23/2013 05:16 PM   Modules accepted: Orders

## 2013-01-23 NOTE — Progress Notes (Signed)
The patient presents today for followup of his stent graft repair of abdominal aortic aneurysm on 05/31/2012. He is done quite well and is here today in his usual state of good health. He is here with his wife. He reports no abdominal pain in the lower surety discomfort. He reports no new major medical difficulties.  Past Medical History  Diagnosis Date  . GERD (gastroesophageal reflux disease)   . AAA (abdominal aortic aneurysm)   . BPH (benign prostatic hypertrophy)   . Arthritis   . Hypertension   . Hyperlipidemia   . H/O cholelithiasis   . Anxiety     situational- surgery  . Sleep apnea     History  Substance Use Topics  . 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 Smokeless tobacco: Never Used     Comment: pt given 1-800-QUIT-NOW  . Alcohol Use: No    Family History  Problem Relation Age of Onset  . Heart disease Mother     had pacemaker  . Kidney disease Mother     Allergies  Allergen Reactions  . Codeine Other (See Comments)    Hallucination  . Ether Other (See Comments)    Hallucinations    Current outpatient prescriptions:aspirin 81 MG tablet, Take 81 mg by mouth daily., Disp: , Rfl: ;  atorvastatin (LIPITOR) 40 MG tablet, Take 40 mg by mouth daily., Disp: , Rfl: ;  doxazosin (CARDURA) 4 MG tablet, Take 4 mg by mouth daily. , Disp: , Rfl: ;  Dutasteride-Tamsulosin HCl (JALYN) 0.5-0.4 MG CAPS, Take 1 capsule by mouth daily. , Disp: , Rfl: ;  metFORMIN (GLUCOPHAGE) 500 MG tablet, Take 500 mg by mouth daily with breakfast., Disp: , Rfl:  Multiple Vitamin (MULTIVITAMIN) tablet, Take 1 tablet by mouth daily., Disp: , Rfl: ;  Omega-3 Fatty Acids (FISH OIL PO), Take 1 capsule by mouth daily. , Disp: , Rfl: ;  pantoprazole (PROTONIX) 40 MG tablet, Take 40 mg by mouth daily., Disp: , Rfl: ;  amoxicillin-clavulanate (AUGMENTIN) 875-125 MG per tablet, Take 1 tablet by mouth every 12 (twelve) hours., Disp: 14 tablet, Rfl: 0 oxyCODONE (ROXICODONE)  5 MG immediate release tablet, Take 1 tablet (5 mg total) by mouth every 6 (six) hours as needed for pain., Disp: 30 tablet, Rfl: 0  BP 148/75  Pulse 71  Resp 18  Ht 6' (1.829 m)  Wt 156 lb 14.4 oz (71.169 kg)  BMI 21.27 kg/m2  Body mass index is 21.27 kg/(m^2).       Physical exam well-developed thin white male no acute distress Neurologically grossly intact Heart rate and rhythm regular Respirations equal nonlabored Abdomen soft nontender no palpable mass noted no pulsatile mass noted. Lower trim obese with a 2+ popliteal and dorsalis pedis pulses without evidence of peripheral aneurysm  CT scan today shows excellent positioning of the stent graft with no evidence of endoleak. He has had continued regression of his aneurysm sac. Maximal size is 5.8 cm down from 6.4 cm 6 months ago  Impression and plan stable treatment of abdominal aortic aneurysm with Gore stent graft. The patient be seen again in one year with repeat CT scan for followup.

## 2013-04-16 DIAGNOSIS — R7309 Other abnormal glucose: Secondary | ICD-10-CM | POA: Diagnosis not present

## 2013-04-16 DIAGNOSIS — I10 Essential (primary) hypertension: Secondary | ICD-10-CM | POA: Diagnosis not present

## 2013-04-16 DIAGNOSIS — E7800 Pure hypercholesterolemia, unspecified: Secondary | ICD-10-CM | POA: Diagnosis not present

## 2013-04-23 DIAGNOSIS — J029 Acute pharyngitis, unspecified: Secondary | ICD-10-CM | POA: Diagnosis not present

## 2013-04-23 DIAGNOSIS — M549 Dorsalgia, unspecified: Secondary | ICD-10-CM | POA: Diagnosis not present

## 2013-04-23 DIAGNOSIS — R7309 Other abnormal glucose: Secondary | ICD-10-CM | POA: Diagnosis not present

## 2013-10-05 DIAGNOSIS — E7800 Pure hypercholesterolemia, unspecified: Secondary | ICD-10-CM | POA: Diagnosis not present

## 2013-10-05 DIAGNOSIS — I10 Essential (primary) hypertension: Secondary | ICD-10-CM | POA: Diagnosis not present

## 2013-10-05 DIAGNOSIS — R7309 Other abnormal glucose: Secondary | ICD-10-CM | POA: Diagnosis not present

## 2013-10-09 DIAGNOSIS — R7309 Other abnormal glucose: Secondary | ICD-10-CM | POA: Diagnosis not present

## 2013-10-09 DIAGNOSIS — I2510 Atherosclerotic heart disease of native coronary artery without angina pectoris: Secondary | ICD-10-CM | POA: Diagnosis not present

## 2013-10-09 DIAGNOSIS — K209 Esophagitis, unspecified without bleeding: Secondary | ICD-10-CM | POA: Diagnosis not present

## 2013-10-09 DIAGNOSIS — N400 Enlarged prostate without lower urinary tract symptoms: Secondary | ICD-10-CM | POA: Diagnosis not present

## 2013-10-31 DIAGNOSIS — R6889 Other general symptoms and signs: Secondary | ICD-10-CM | POA: Diagnosis not present

## 2013-11-02 DIAGNOSIS — R1310 Dysphagia, unspecified: Secondary | ICD-10-CM | POA: Diagnosis not present

## 2014-01-09 DIAGNOSIS — R6889 Other general symptoms and signs: Secondary | ICD-10-CM | POA: Diagnosis not present

## 2014-01-09 DIAGNOSIS — K219 Gastro-esophageal reflux disease without esophagitis: Secondary | ICD-10-CM | POA: Diagnosis not present

## 2014-01-28 ENCOUNTER — Other Ambulatory Visit: Payer: Self-pay | Admitting: Vascular Surgery

## 2014-01-28 DIAGNOSIS — I714 Abdominal aortic aneurysm, without rupture: Secondary | ICD-10-CM | POA: Diagnosis not present

## 2014-01-28 LAB — CREATININE, SERUM: CREATININE: 1.03 mg/dL (ref 0.50–1.35)

## 2014-01-28 LAB — BUN: BUN: 11 mg/dL (ref 6–23)

## 2014-01-29 ENCOUNTER — Ambulatory Visit: Payer: Medicare Other | Admitting: Vascular Surgery

## 2014-01-29 ENCOUNTER — Other Ambulatory Visit: Payer: Medicare Other

## 2014-02-04 ENCOUNTER — Encounter: Payer: Self-pay | Admitting: Vascular Surgery

## 2014-02-05 ENCOUNTER — Ambulatory Visit (INDEPENDENT_AMBULATORY_CARE_PROVIDER_SITE_OTHER): Payer: BC Managed Care – PPO | Admitting: Vascular Surgery

## 2014-02-05 ENCOUNTER — Encounter: Payer: Self-pay | Admitting: Vascular Surgery

## 2014-02-05 ENCOUNTER — Ambulatory Visit
Admission: RE | Admit: 2014-02-05 | Discharge: 2014-02-05 | Disposition: A | Discharge status: Home / Self Care | Payer: BC Managed Care – PPO | Source: Ambulatory Visit | Attending: Vascular Surgery | Admitting: Vascular Surgery

## 2014-02-05 VITALS — BP 173/95 | HR 60 | Resp 16 | Ht 71.0 in | Wt 162.8 lb

## 2014-02-05 DIAGNOSIS — I714 Abdominal aortic aneurysm, without rupture, unspecified: Secondary | ICD-10-CM

## 2014-02-05 DIAGNOSIS — Z48812 Encounter for surgical aftercare following surgery on the circulatory system: Secondary | ICD-10-CM

## 2014-02-05 DIAGNOSIS — N210 Calculus in bladder: Secondary | ICD-10-CM | POA: Diagnosis not present

## 2014-02-05 DIAGNOSIS — K5730 Diverticulosis of large intestine without perforation or abscess without bleeding: Secondary | ICD-10-CM | POA: Diagnosis not present

## 2014-02-05 MED ORDER — IOHEXOL 350 MG/ML SOLN
75.0000 mL | Freq: Once | INTRAVENOUS | Status: AC | PRN
  Administered 2014-02-05: 75 mL via INTRAVENOUS

## 2014-02-05 NOTE — Addendum Note (Signed)
Addended by: Sharee PimpleMCCHESNEY, Olando Willems K on: 02/05/2014 03:29 PM   Modules accepted: Orders

## 2014-02-05 NOTE — Progress Notes (Signed)
    Established Abdominal Aortic Aneurysm  History of Present Illness  The patient is a 73 y.o. (10/15/1940) male who presents for follow-up s/p EVAR 05/31/12. He is doing well and notes no changes in his medical history since his last visit on 01/23/2013. He denies any abdominal or back pain. He does mention shoulder pain that interferes with his golf. He says he will go see his PCP about this. He continues to smoke.   Physical Examination  Filed Vitals:   02/05/14 1231  BP: 173/95  Pulse: 60  Resp: 16  Height: 5\' 11"  (1.803 m)  Weight: 162 lb 12.8 oz (73.846 kg)   Body mass index is 22.72 kg/(m^2).  General: A&O x 3, WDWN male in NAD  Pulmonary: Sym exp, good air movt, CTAB, no rales, rhonchi, & wheezing   Cardiac: RRR, Nl S1, S2, no Murmurs, rubs or gallops, no carotid bruits.   Vascular: 2+ dorsalis pedis and posterior tibial pulses bilaterally.   Gastrointestinal: soft, NTND, no masses  Non-Invasive Vascular Imaging  CT ABD/PELVIS    Previous size: 5.0 x 5.8 cm (Date: 01/23/13)  Current size:  4.8 x 3.8 cm (Date: 02/05/14)  Medical Decision Making  The patient is a 73 y.o. male who is s/p EVAR 05/31/12. His CT scan today shows a 1 cm decrease in aneurysm sac size without evidence of endoleak. The right iliac limb measures up to 2.5 cm without the aneurysm sac. He is stable and doing well. He will follow up in one year with an EVAR duplex. He is currently on an aspirin and statin regimen. He was counseled on smoking cessation. His blood pressure today was 173/95 and is not currently on antihypertensive medications. He will follow up with his PCP for further management.    Jonathan BergerKimberly Trinh, PA-C [facility] [place] Office: [phone] Pager: [phone]  02/05/2014, 12:53 PM   This patient was seen in conjunction with Dr. Arbie CookeyEarly   I have examined the patient, reviewed and agree with above. Very good result with shrinking aneurysm diameter.  Will see him again in 1 year with ultrasound  Jonathan Bellanca, MD 02/05/2014 1:36 PM

## 2014-02-06 DIAGNOSIS — R7309 Other abnormal glucose: Secondary | ICD-10-CM | POA: Diagnosis not present

## 2014-04-10 DIAGNOSIS — D649 Anemia, unspecified: Secondary | ICD-10-CM | POA: Diagnosis not present

## 2014-04-10 DIAGNOSIS — E780 Pure hypercholesterolemia: Secondary | ICD-10-CM | POA: Diagnosis not present

## 2014-04-10 DIAGNOSIS — I10 Essential (primary) hypertension: Secondary | ICD-10-CM | POA: Diagnosis not present

## 2014-04-10 DIAGNOSIS — R7309 Other abnormal glucose: Secondary | ICD-10-CM | POA: Diagnosis not present

## 2014-10-24 DIAGNOSIS — I2510 Atherosclerotic heart disease of native coronary artery without angina pectoris: Secondary | ICD-10-CM | POA: Diagnosis not present

## 2014-10-24 DIAGNOSIS — N400 Enlarged prostate without lower urinary tract symptoms: Secondary | ICD-10-CM | POA: Diagnosis not present

## 2014-10-24 DIAGNOSIS — R7309 Other abnormal glucose: Secondary | ICD-10-CM | POA: Diagnosis not present

## 2014-10-24 DIAGNOSIS — I10 Essential (primary) hypertension: Secondary | ICD-10-CM | POA: Diagnosis not present

## 2014-12-08 IMAGING — CT CT CTA ABD/PEL W/CM AND/OR W/O CM
3 of 10 series · 10 of 36 positions shown, 15 images · IV contrast (75CC OMNI 350)
Comparison: 01/09/2013

CLINICAL DATA: Endovascular repair of the abdominal aortic
aneurysm.

EXAM:
CT ANGIOGRAPHY ABDOMEN AND PELVIS
TECHNIQUE: Multidetector CT imaging of the abdomen and pelvis was performed
using the standard protocol during bolus administration of
intravenous contrast. Multiplanar reconstructed images including
MIPs were obtained and reviewed to evaluate the vascular anatomy.
CONTRAST:  75 mL Omnipaque 350

[Series 5: angio 2.5 · axial · 0.74mm/px · z∈[-322,-60]mm · 4 of 176 slices shown, 9 images]
[im 36/176  soft-tissue]
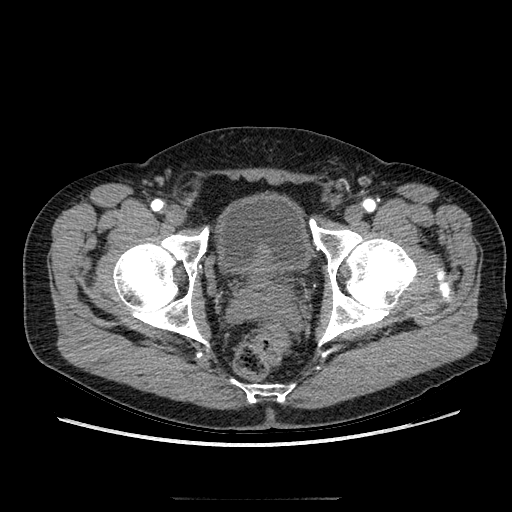
[im 36/176  lung]
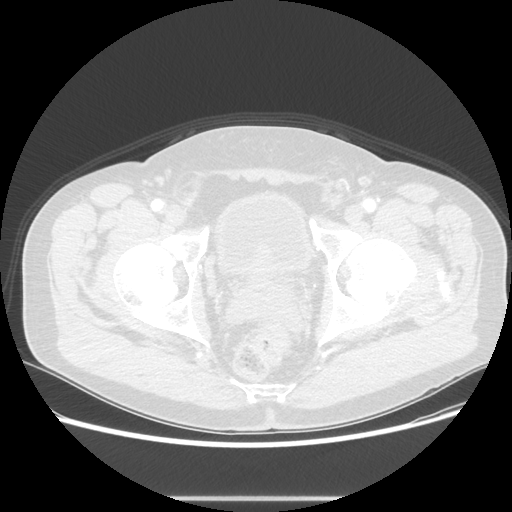
[im 36/176  bone]
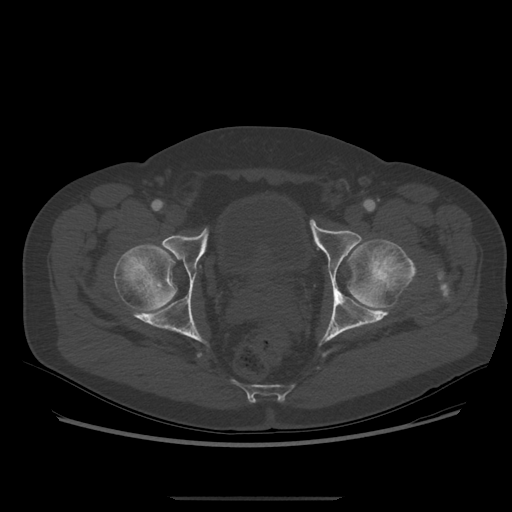
[im 71/176  soft-tissue]
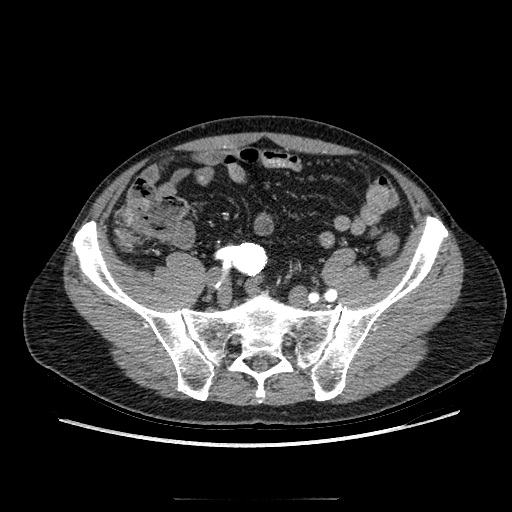
[im 71/176  lung]
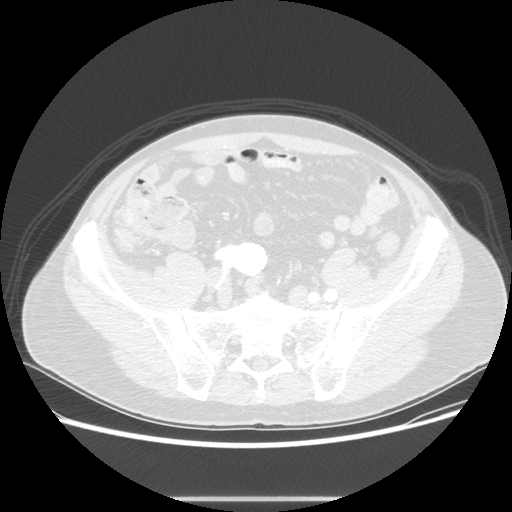
[im 106/176  soft-tissue]
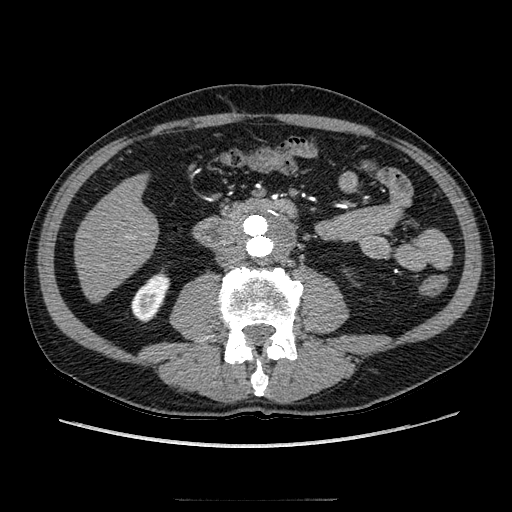
[im 106/176  lung]
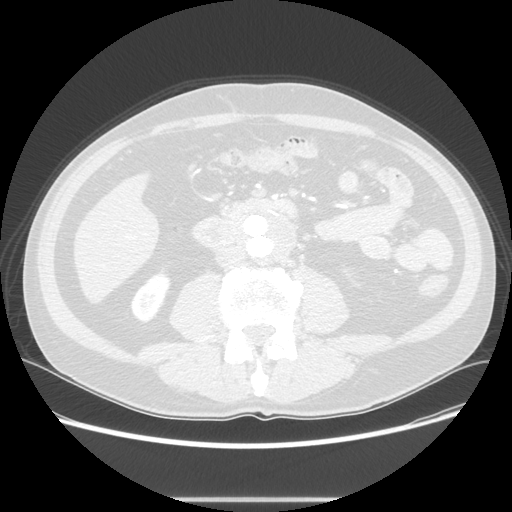
[im 141/176  soft-tissue]
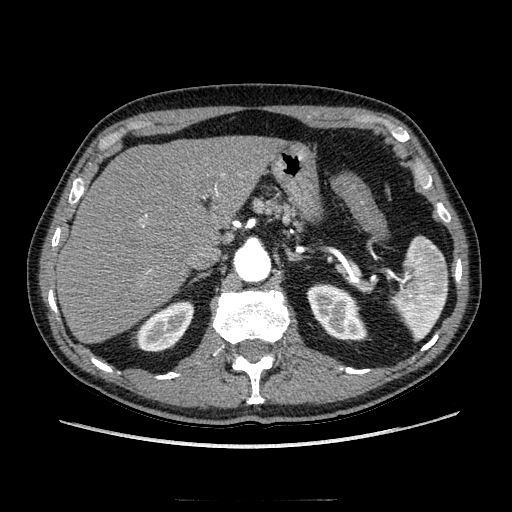
[im 141/176  lung]
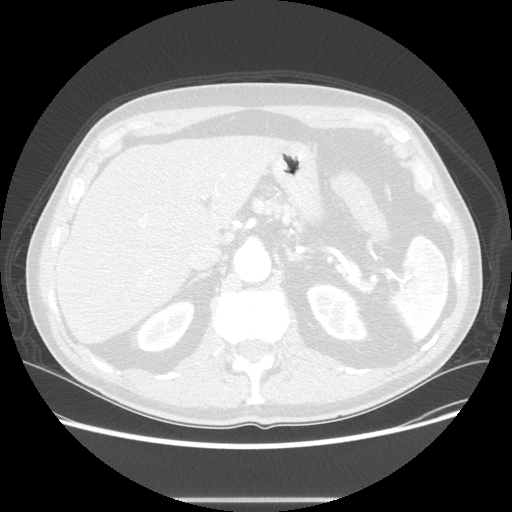

[Series 602: sagittal body · sagittal · 0.90mm/px · 3 of 149 slices shown (1 of 2)]
[im 38/149  soft-tissue]
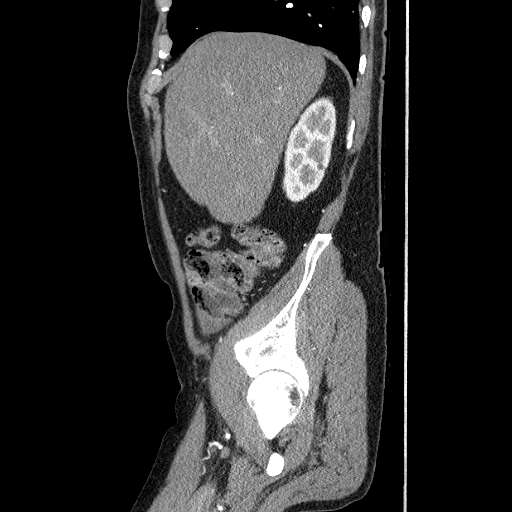
[im 75/149  soft-tissue]
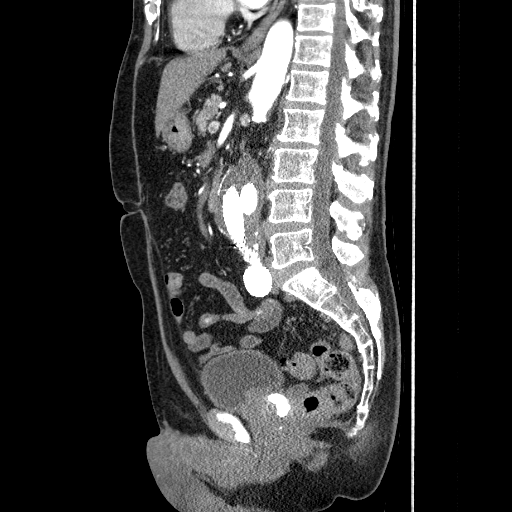
[im 112/149  soft-tissue]
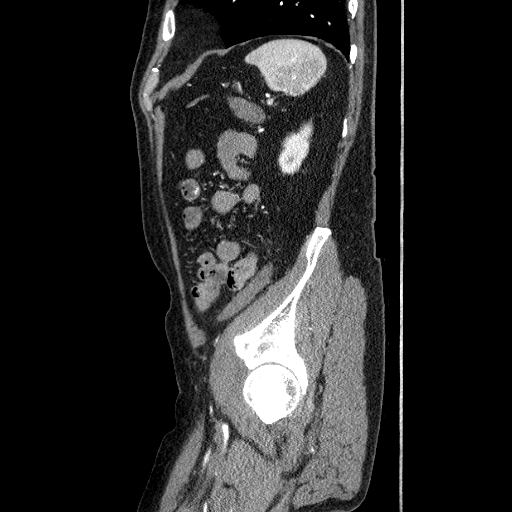

[Series 607: sagittal body · sagittal · 0.74mm/px · 3 of 147 slices shown (2 of 2)]
[im 37/147  soft-tissue]
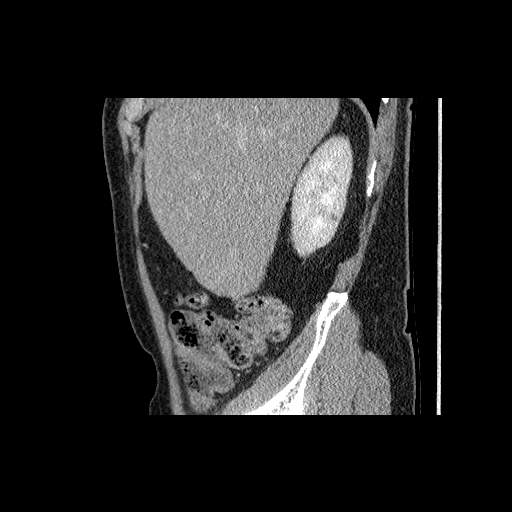
[im 74/147  soft-tissue]
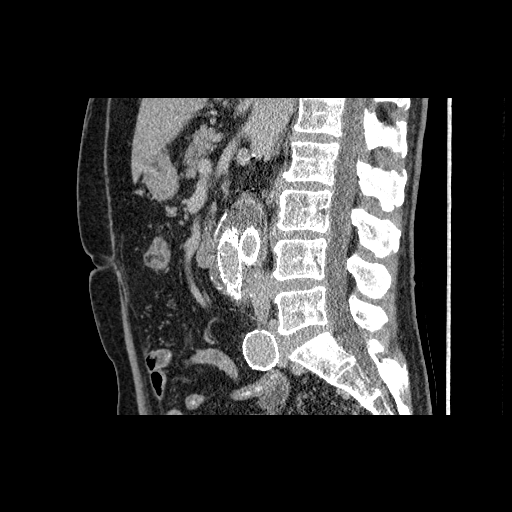
[im 110/147  soft-tissue]
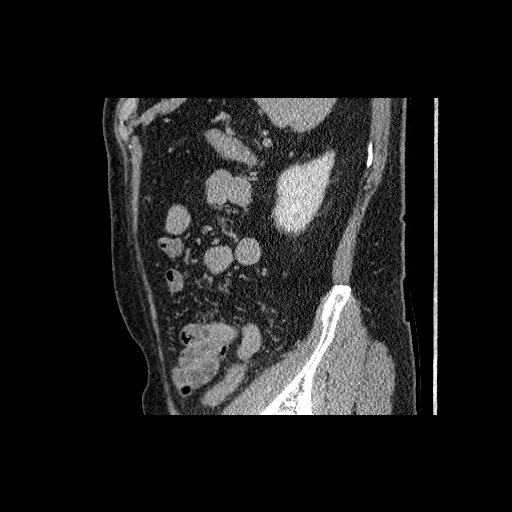

[10 of 36 positions shown; findings below may reference images not displayed]

FINDINGS: ARTERIAL FINDINGS:

Aorta: Infrarenal abdominal aortic stent graft. The stent graft and
limbs are patent. Limbs extend into the common iliac arteries
bilaterally. The aneurysm sac now measures 4.8 x 3.8 cm and
previously measured 5.0 x 5.8 cm. No evidence for an endoleak. The
right iliac limb measures up to 2.5 cm without aneurysm sac. The
right iliac stent graft measured 2.3 cm on the previous examination.

Celiac axis: Celiac trunk and main branches are patent.
Incidentally, there is a replaced left hepatic artery coming off the
left gastric artery.

Superior mesenteric: SMA and main branches are widely patent.

Left renal:          Left renal artery is widely patent

Right renal:         Right renal artery is widely patent.

Inferior mesenteric: The origin of the IMA is occluded. There is
distal reconstitution.

Left iliac: Stent extends into the left common iliac artery. The
left iliac arteries are patent. The proximal left femoral arteries
are patent.

Right iliac: Right iliac arteries are patent. Ectasia of the right
common femoral artery measuring up to 1.3 cm and stable. Proximal
right femoral arteries are patent.

Venous findings: The IVC and proximal iliac arteries are patent.
Portal venous system is patent.

Review of the MIP images confirms the above findings.

NONVASCULAR FINDINGS:

Lung bases are clear. Gallbladder has been removed. No gross
abnormality to the liver, spleen, pancreas or adrenal glands. Again
noted are low-density structures in both kidneys. Largest roughly
measures 0.9 cm in the right kidney upper pole. These are too small
to definitively characterize but likely represent cysts. No
significant free fluid or lymphadenopathy. There is a 0.7 cm stone
along the right side of the bladder. No evidence for right
hydronephrosis. There is a small diverticula along the left
posterior bladder. Prostate is enlarged with large calcifications.
Prostate measures 5.9 cm in the transverse dimension. Extensive
diverticulosis in the sigmoid colon. High-density material in the
appendix without inflammation. Findings are consistent with an
appendicolith. Normal appearance of the small bowel. No acute bone
abnormality.
IMPRESSION: Endovascular repair of the abdominal aortic aneurysm. Stent graft is
patent. The aneurysm sac is decreasing in size and no evidence for
an endoleak.

No acute abnormality in the abdomen or pelvis.

Sigmoid diverticulosis without acute inflammation.

Probable renal cysts as described. Recommend attention to the
kidneys on follow up imaging.

Bladder stone.

## 2015-02-11 ENCOUNTER — Ambulatory Visit (HOSPITAL_COMMUNITY)
Admission: RE | Admit: 2015-02-11 | Discharge: 2015-02-11 | Disposition: A | Discharge status: Home / Self Care | Payer: Medicare HMO | Source: Ambulatory Visit | Attending: Vascular Surgery | Admitting: Vascular Surgery

## 2015-02-11 DIAGNOSIS — I714 Abdominal aortic aneurysm, without rupture, unspecified: Secondary | ICD-10-CM

## 2015-02-11 DIAGNOSIS — E119 Type 2 diabetes mellitus without complications: Secondary | ICD-10-CM | POA: Insufficient documentation

## 2015-02-11 DIAGNOSIS — I10 Essential (primary) hypertension: Secondary | ICD-10-CM | POA: Insufficient documentation

## 2015-02-11 DIAGNOSIS — E785 Hyperlipidemia, unspecified: Secondary | ICD-10-CM | POA: Insufficient documentation

## 2015-02-11 DIAGNOSIS — Z48812 Encounter for surgical aftercare following surgery on the circulatory system: Secondary | ICD-10-CM | POA: Diagnosis present

## 2015-02-14 ENCOUNTER — Encounter: Payer: Self-pay | Admitting: Family

## 2015-02-18 ENCOUNTER — Ambulatory Visit (INDEPENDENT_AMBULATORY_CARE_PROVIDER_SITE_OTHER): Payer: Medicare HMO | Admitting: Family

## 2015-02-18 ENCOUNTER — Encounter: Payer: Self-pay | Admitting: Family

## 2015-02-18 VITALS — BP 120/84 | HR 79 | Ht 71.0 in | Wt 161.4 lb

## 2015-02-18 DIAGNOSIS — F17200 Nicotine dependence, unspecified, uncomplicated: Secondary | ICD-10-CM

## 2015-02-18 DIAGNOSIS — Z720 Tobacco use: Secondary | ICD-10-CM

## 2015-02-18 DIAGNOSIS — Z48812 Encounter for surgical aftercare following surgery on the circulatory system: Secondary | ICD-10-CM | POA: Diagnosis not present

## 2015-02-18 DIAGNOSIS — Z95828 Presence of other vascular implants and grafts: Secondary | ICD-10-CM

## 2015-02-18 NOTE — Progress Notes (Signed)
[facility] [place]  Established EVAR  History of Present Illness  Jonathan Levine is a 74 y.o. ([birth year]) male patient of Dr. Arbie Cookey who presents for follow-up s/p EVAR 05/31/12. He is doing well and notes no changes in his medical history.  He denies any abdominal or back pain.   Pt denies any history of stroke or TIA.  He denies claudication symptoms with walking.   Pt Diabetic: "borderline" Pt smoker: smoker  (1 ppd, started at age 80 yrs)   Past Medical History  Diagnosis Date  . GERD (gastroesophageal reflux disease)   . AAA (abdominal aortic aneurysm) (HCC)   . BPH (benign prostatic hypertrophy)   . Arthritis   . Hypertension   . Hyperlipidemia   . H/O cholelithiasis   . Anxiety     situational- surgery  . Sleep apnea   . Diabetes mellitus without complication [facility])    Past Surgical History  Procedure Laterality Date  . Vasectomy  1980's  . Cholecystectomy    . Abdominal aortic endovascular stent graft N/A 05/31/2012    Procedure: ABDOMINAL AORTIC ENDOVASCULAR STENT GRAFT;  Surgeon: Larina Earthly, MD;  Location: [facility] OR;  Service: Vascular;  Laterality: N/A;  GORE; Ultrasound guided   Social History Social History  Substance Use Topics  . 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 Smokeless tobacco: Never Used     Comment: pt given 1-800-QUIT-NOW  . Alcohol Use: No   Family History Family History  Problem Relation Age of Onset  . Heart disease Mother     had pacemaker  . Kidney disease Mother   . Hypertension Mother   . Varicose Veins Mother   . Hyperlipidemia Mother    Current Outpatient Prescriptions on File Prior to Visit  Medication Sig Dispense Refill  . amoxicillin-clavulanate (AUGMENTIN) 875-125 MG per tablet Take 1 tablet by mouth every 12 (twelve) hours. 14 tablet 0  . aspirin 81 MG tablet Take 81 mg by mouth daily.    Marland Kitchen atorvastatin (LIPITOR) 40 MG tablet Take 20 mg by mouth daily.     Marland Kitchen b  complex vitamins tablet Take 1 tablet by mouth daily.    . Cholecalciferol (VITAMIN D HIGH POTENCY PO) Take by mouth.    . doxazosin (CARDURA) 4 MG tablet Take 2 mg by mouth daily.     . Dutasteride-Tamsulosin HCl (JALYN) 0.5-0.4 MG CAPS Take 1 capsule by mouth daily.     . finasteride (PROSCAR) 5 MG tablet Take 5 mg by mouth daily.    . metFORMIN (GLUCOPHAGE) 500 MG tablet Take 500 mg by mouth daily with breakfast.    . Multiple Vitamin (MULTIVITAMIN) tablet Take 1 tablet by mouth daily.    . Omega-3 Fatty Acids (FISH OIL PO) Take 1 capsule by mouth daily.     Marland Kitchen oxyCODONE (ROXICODONE) 5 MG immediate release tablet Take 1 tablet (5 mg total) by mouth every 6 (six) hours as needed for pain. 30 tablet 0  . pantoprazole (PROTONIX) 40 MG tablet Take 40 mg by mouth daily.    . tamsulosin (FLOMAX) 0.4 MG CAPS capsule Take 0.4 mg by mouth.     No current facility-administered medications on file prior to visit.   Allergies  Allergen Reactions  . Codeine Other (See Comments)    Hallucination  . Ether Other (See Comments)    Hallucinations     ROS: See HPI for pertinent positives and negatives.  Physical  Examination  Filed Vitals:   02/18/15 1403  BP: 120/84  Pulse: 79  Height: 5\' 11"  (1.803 m)  Weight: 161 lb 6.4 oz (73.211 kg)  SpO2: 98%   Body mass index is 22.52 kg/(m^2).  General: A&O x 3, WD  Pulmonary: Sym exp, fair air movt, CTAB, no rales, rhonchi, or wheezing.   Cardiac: RRR, Nl S1, S2, no murmur appreciated  Vascular: Vessel Right Left  Radial 2+Palpable 2+Palpable  Carotid  without bruit  without bruit  Aorta Not palpable N/A  Femoral 2+Palpable 2+Palpable  Popliteal Not palpable Not palpable  PT notPalpable notPalpable  DP Faintly Palpable 1+Palpable   Gastrointestinal: soft, NTND, -G/R, - HSM, - palpable masses, - CVAT B.  Musculoskeletal: M/S 5/5 throughout, extremities without ischemic changes.  Neurologic: Pain and light touch intact in extremities,  Motor exam as listed above   CTA Abd/Pelvis Duplex (Date: 02/05/14) Left iliac: Stent extends into the left common iliac artery. The left iliac arteries are patent. The proximal left femoral arteries are patent.  Right iliac: Right iliac arteries are patent. Ectasia of the right common femoral artery measuring up to 1.3 cm and stable. Proximal right femoral arteries are patent.  Aorta: Infrarenal abdominal aortic stent graft. The stent graft and limbs are patent. Limbs extend into the common iliac arteries bilaterally. The aneurysm sac now measures 4.8 x 3.8 cm and previously measured 5.0 x 5.8 cm. No evidence for an endoleak. The right iliac limb measures up to 2.5 cm without aneurysm sac. The right iliac stent graft measured 2.3 cm on the previous examination.   Non-Invasive Vascular Imaging  EVAR Duplex (Date: 02/11/15) ABDOMINAL AORTA DUPLEX EVALUATION - POST ENDOVASCULAR REPAIR    INDICATION:     PREVIOUS INTERVENTION(S): Abdominal aortic endovascular stent graft 05/31/2012    DUPLEX EXAM:      DIAMETER AP (cm) DIAMETER TRANSVERSE (cm) VELOCITIES (cm/sec)  Aorta 3.7 3.9 41.8  Right Common Iliac 2.0 1.5 62  Left Common Iliac 1.5 1.3 61    Comparison Study       Date DIAMETER AP (cm) DIAMETER TRANSVERSE (cm)         ADDITIONAL FINDINGS: Dilitation noted in the proximal to mid right CIA of 2.6 cm x 2.4 cm in the AP transverse diameter.    IMPRESSION: No evidence of endoleak noted.    Compared to the previous exam:  No post-surgical examinations available for comparison.      Medical Decision Making  Jonathan Levine is a 74 y.o. male who presents s/p EVAR (Date: 05/31/2012).  Pt is asymptomatic with a decrease in sac size compared to CTA a year ago. I discussed with Dr. Arbie CookeyEarly today's EVAR duplex results compared to CTA in October 2015.  I discussed with the patient the importance of surveillance of the endograft.  The next CTA will be scheduled for 12  months.  The patient will follow up with Dr. Arbie CookeyEarly in 12 months with these studies.  Unfortunately he continues to smoke but is receptive to quitting. The patient was counseled re smoking cessation and given several free resources re smoking cessation.  I emphasized the importance of maximal medical management including strict control of blood pressure, blood glucose, and lipid levels, antiplatelet agents, obtaining regular exercise, and cessation of smoking.   Thank you for allowing us to participate in this patient's care.  Charisse MarchSuzanne Keondrick Dilks, RN, MSN, FNP-C [facility] [place] Office: [phone]  Clinic Physician: Early  02/18/2015, 2:28 PM

## 2015-02-18 NOTE — Patient Instructions (Signed)
Smoking Cessation, Tips for Success If you are ready to quit smoking, congratulations! You have chosen to help yourself be healthier. Cigarettes bring nicotine, tar, carbon monoxide, and other irritants into your body. Your lungs, heart, and blood vessels will be able to work better without these poisons. There are many different ways to quit smoking. Nicotine gum, nicotine patches, a nicotine inhaler, or nicotine nasal spray can help with physical craving. Hypnosis, support groups, and medicines help break the habit of smoking. WHAT THINGS CAN I DO TO MAKE QUITTING EASIER?  Here are some tips to help you quit for good:  Pick a date when you will quit smoking completely. Tell all of your friends and family about your plan to quit on that date.  Do not try to slowly cut down on the number of cigarettes you are smoking. Pick a quit date and quit smoking completely starting on that day.  Throw away all cigarettes.   Clean and remove all ashtrays from your home, work, and car.  On a card, write down your reasons for quitting. Carry the card with you and read it when you get the urge to smoke.  Cleanse your body of nicotine. Drink enough water and fluids to keep your urine clear or pale yellow. Do this after quitting to flush the nicotine from your body.  Learn to predict your moods. Do not let a bad situation be your excuse to have a cigarette. Some situations in your life might tempt you into wanting a cigarette.  Never have "just one" cigarette. It leads to wanting another and another. Remind yourself of your decision to quit.  Change habits associated with smoking. If you smoked while driving or when feeling stressed, try other activities to replace smoking. Stand up when drinking your coffee. Brush your teeth after eating. Sit in a different chair when you read the paper. Avoid alcohol while trying to quit, and try to drink fewer caffeinated beverages. Alcohol and caffeine may urge you to  smoke.  Avoid foods and drinks that can trigger a desire to smoke, such as sugary or spicy foods and alcohol.  Ask people who smoke not to smoke around you.  Have something planned to do right after eating or having a cup of coffee. For example, plan to take a walk or exercise.  Try a relaxation exercise to calm you down and decrease your stress. Remember, you may be tense and nervous for the first 2 weeks after you quit, but this will pass.  Find new activities to keep your hands busy. Play with a pen, coin, or rubber band. Doodle or draw things on paper.  Brush your teeth right after eating. This will help cut down on the craving for the taste of tobacco after meals. You can also try mouthwash.   Use oral substitutes in place of cigarettes. Try using lemon drops, carrots, cinnamon sticks, or chewing gum. Keep them handy so they are available when you have the urge to smoke.  When you have the urge to smoke, try deep breathing.  Designate your home as a nonsmoking area.  If you are a heavy smoker, ask your health care provider about a prescription for nicotine chewing gum. It can ease your withdrawal from nicotine.  Reward yourself. Set aside the cigarette money you save and buy yourself something nice.  Look for support from others. Join a support group or smoking cessation program. Ask someone at home or at work to help you with your plan   to quit smoking.  Always ask yourself, "Do I need this cigarette or is this just a reflex?" Tell yourself, "Today, I choose not to smoke," or "I do not want to smoke." You are reminding yourself of your decision to quit.  Do not replace cigarette smoking with electronic cigarettes (commonly called e-cigarettes). The safety of e-cigarettes is unknown, and some may contain harmful chemicals.  If you relapse, do not give up! Plan ahead and think about what you will do the next time you get the urge to smoke. HOW WILL I FEEL WHEN I QUIT SMOKING? You  may have symptoms of withdrawal because your body is used to nicotine (the addictive substance in cigarettes). You may crave cigarettes, be irritable, feel very hungry, cough often, get headaches, or have difficulty concentrating. The withdrawal symptoms are only temporary. They are strongest when you first quit but will go away within 10-14 days. When withdrawal symptoms occur, stay in control. Think about your reasons for quitting. Remind yourself that these are signs that your body is healing and getting used to being without cigarettes. Remember that withdrawal symptoms are easier to treat than the major diseases that smoking can cause.  Even after the withdrawal is over, expect periodic urges to smoke. However, these cravings are generally short lived and will go away whether you smoke or not. Do not smoke! WHAT RESOURCES ARE AVAILABLE TO HELP ME QUIT SMOKING? Your health care provider can direct you to community resources or hospitals for support, which may include:  Group support.  Education.  Hypnosis.  Therapy.   This information is not intended to replace advice given to you by your health care provider. Make sure you discuss any questions you have with your health care provider.   Document Released: 12/26/2003 Document Revised: 04/19/2014 Document Reviewed: 09/14/2012 Elsevier Interactive Patient Education 2016 Elsevier Inc[employer]    Steps to Quit Smoking  Smoking tobacco can be harmful to your health and can affect almost every organ in your body. Smoking puts you, and those around you, at risk for developing many serious chronic diseases. Quitting smoking is difficult, but it is one of the best things that you can do for your health. It is never too late to quit. WHAT ARE THE BENEFITS OF QUITTING SMOKING? When you quit smoking, you lower your risk of developing serious diseases and conditions, such as:  Lung cancer or lung disease, such as COPD.  Heart disease.  Stroke.  Heart  attack.  Infertility.  Osteoporosis and bone fractures. Additionally, symptoms such as coughing, wheezing, and shortness of breath may get better when you quit. You may also find that you get sick less often because your body is stronger at fighting off colds and infections. If you are pregnant, quitting smoking can help to reduce your chances of having a baby of low birth weight. HOW DO I GET READY TO QUIT? When you decide to quit smoking, create a plan to make sure that you are successful. Before you quit:  Pick a date to quit. Set a date within the next two weeks to give you time to prepare.  Write down the reasons why you are quitting. Keep this list in places where you will see it often, such as on your bathroom mirror or in your car or wallet.  Identify the people, places, things, and activities that make you want to smoke (triggers) and avoid them. Make sure to take these actions:  Throw away all cigarettes at home, at work,   and in your car.  Throw away smoking accessories, such as ashtrays and lighters.  Clean your car and make sure to empty the ashtray.  Clean your home, including curtains and carpets.  Tell your family, friends, and coworkers that you are quitting. Support from your loved ones can make quitting easier.  Talk with your health care provider about your options for quitting smoking.  Find out what treatment options are covered by your health insurance. WHAT STRATEGIES CAN I USE TO QUIT SMOKING?  Talk with your healthcare provider about different strategies to quit smoking. Some strategies include:  Quitting smoking altogether instead of gradually lessening how much you smoke over a period of time. Research shows that quitting "cold turkey" is more successful than gradually quitting.  Attending in-person counseling to help you build problem-solving skills. You are more likely to have success in quitting if you attend several counseling sessions. Even short  sessions of 10 minutes can be effective.  Finding resources and support systems that can help you to quit smoking and remain smoke-free after you quit. These resources are most helpful when you use them often. They can include:  Online chats with a counselor.  Telephone quitlines.  Printed self-help materials.  Support groups or group counseling.  Text messaging programs.  Mobile phone applications.  Taking medicines to help you quit smoking. (If you are pregnant or breastfeeding, talk with your health care provider first.) Some medicines contain nicotine and some do not. Both types of medicines help with cravings, but the medicines that include nicotine help to relieve withdrawal symptoms. Your health care provider may recommend:  Nicotine patches, gum, or lozenges.  Nicotine inhalers or sprays.  Non-nicotine medicine that is taken by mouth. Talk with your health care provider about combining strategies, such as taking medicines while you are also receiving in-person counseling. Using these two strategies together makes you more likely to succeed in quitting than if you used either strategy on its own. If you are pregnant or breastfeeding, talk with your health care provider about finding counseling or other support strategies to quit smoking. Do not take medicine to help you quit smoking unless told to do so by your health care provider. WHAT THINGS CAN I DO TO MAKE IT EASIER TO QUIT? Quitting smoking might feel overwhelming at first, but there is a lot that you can do to make it easier. Take these important actions:  Reach out to your family and friends and ask that they support and encourage you during this time. Call telephone quitlines, reach out to support groups, or work with a counselor for support.  Ask people who smoke to avoid smoking around you.  Avoid places that trigger you to smoke, such as bars, parties, or smoke-break areas at work.  Spend time around people who do  not smoke.  Lessen stress in your life, because stress can be a smoking trigger for some people. To lessen stress, try:  Exercising regularly.  Deep-breathing exercises.  Yoga.  Meditating.  Performing a body scan. This involves closing your eyes, scanning your body from head to toe, and noticing which parts of your body are particularly tense. Purposefully relax the muscles in those areas.  Download or purchase mobile phone or tablet apps (applications) that can help you stick to your quit plan by providing reminders, tips, and encouragement. There are many free apps, such as QuitGuide from the CDC (Centers for Disease Control and Prevention). You can find other support for quitting smoking (smoking   cessation) through smokefree.gov and other websites. HOW WILL I FEEL WHEN I QUIT SMOKING? Within the first 24 hours of quitting smoking, you may start to feel some withdrawal symptoms. These symptoms are usually most noticeable 2-3 days after quitting, but they usually do not last beyond 2-3 weeks. Changes or symptoms that you might experience include:  Mood swings.  Restlessness, anxiety, or irritation.  Difficulty concentrating.  Dizziness.  Strong cravings for sugary foods in addition to nicotine.  Mild weight gain.  Constipation.  Nausea.  Coughing or a sore throat.  Changes in how your medicines work in your body.  A depressed mood.  Difficulty sleeping (insomnia). After the first 2-3 weeks of quitting, you may start to notice more positive results, such as:  Improved sense of smell and taste.  Decreased coughing and sore throat.  Slower heart rate.  Lower blood pressure.  Clearer skin.  The ability to breathe more easily.  Fewer sick days. Quitting smoking is very challenging for most people. Do not get discouraged if you are not successful the first time. Some people need to make many attempts to quit before they achieve long-term success. Do your best to  stick to your quit plan, and talk with your health care provider if you have any questions or concerns.   This information is not intended to replace advice given to you by your health care provider. Make sure you discuss any questions you have with your health care provider.   Document Released: 03/23/2001 Document Revised: 08/13/2014 Document Reviewed: 08/13/2014 Elsevier Interactive Patient Education 2016 [employer] Inc[employer]  

## 2016-04-21 ENCOUNTER — Ambulatory Visit: Payer: Medicare HMO | Admitting: Vascular Surgery

## 2020-07-11 DEATH — deceased
